# Patient Record
Sex: Female | Born: 1945
Health system: Southern US, Community
[De-identification: ages and names within clinical notes are randomized; demographics above are authoritative.]

## PROBLEM LIST (undated history)

## (undated) DIAGNOSIS — G459 Transient cerebral ischemic attack, unspecified: Secondary | ICD-10-CM

## (undated) DIAGNOSIS — N6019 Diffuse cystic mastopathy of unspecified breast: Secondary | ICD-10-CM

## (undated) DIAGNOSIS — E739 Lactose intolerance, unspecified: Secondary | ICD-10-CM

## (undated) DIAGNOSIS — I219 Acute myocardial infarction, unspecified: Secondary | ICD-10-CM

## (undated) DIAGNOSIS — K219 Gastro-esophageal reflux disease without esophagitis: Secondary | ICD-10-CM

## (undated) DIAGNOSIS — I10 Essential (primary) hypertension: Secondary | ICD-10-CM

## (undated) DIAGNOSIS — E079 Disorder of thyroid, unspecified: Secondary | ICD-10-CM

## (undated) DIAGNOSIS — M1990 Unspecified osteoarthritis, unspecified site: Secondary | ICD-10-CM

## (undated) DIAGNOSIS — H409 Unspecified glaucoma: Secondary | ICD-10-CM

## (undated) DIAGNOSIS — R159 Full incontinence of feces: Secondary | ICD-10-CM

## (undated) DIAGNOSIS — R0989 Other specified symptoms and signs involving the circulatory and respiratory systems: Secondary | ICD-10-CM

## (undated) DIAGNOSIS — K589 Irritable bowel syndrome without diarrhea: Secondary | ICD-10-CM

## (undated) HISTORY — DX: Full incontinence of feces: R15.9

## (undated) HISTORY — DX: Gastro-esophageal reflux disease without esophagitis: K21.9

## (undated) HISTORY — DX: Unspecified glaucoma: H40.9

## (undated) HISTORY — PX: OTHER SURGICAL HISTORY: SHX169

## (undated) HISTORY — DX: Essential (primary) hypertension: I10

## (undated) HISTORY — DX: Other specified symptoms and signs involving the circulatory and respiratory systems: R09.89

## (undated) HISTORY — DX: Unspecified osteoarthritis, unspecified site: M19.90

## (undated) HISTORY — DX: Irritable bowel syndrome without diarrhea: K58.9

## (undated) HISTORY — PX: FOOT SURGERY: SHX648

## (undated) HISTORY — PX: ABDOMINAL HYSTERECTOMY: SHX81

## (undated) HISTORY — DX: Lactose intolerance, unspecified: E73.9

## (undated) HISTORY — DX: Acute myocardial infarction, unspecified: I21.9

## (undated) HISTORY — DX: Irritable bowel syndrome, unspecified: K58.9

## (undated) HISTORY — DX: Diffuse cystic mastopathy of unspecified breast: N60.19

## (undated) HISTORY — PX: BREAST SURGERY: SHX581

## (undated) HISTORY — PX: CATARACT EXTRACTION: SUR2

## (undated) HISTORY — DX: Transient cerebral ischemic attack, unspecified: G45.9

## (undated) HISTORY — DX: Disorder of thyroid, unspecified: E07.9

---

## 1998-02-18 ENCOUNTER — Other Ambulatory Visit: Admission: RE | Admit: 1998-02-18 | Discharge: 1998-02-18 | Payer: Self-pay | Admitting: Obstetrics

## 1999-09-01 ENCOUNTER — Other Ambulatory Visit: Admission: RE | Admit: 1999-09-01 | Discharge: 1999-09-01 | Payer: Self-pay | Admitting: Obstetrics

## 2011-05-17 HISTORY — PX: ESOPHAGOGASTRODUODENOSCOPY: SHX1529

## 2012-12-05 HISTORY — PX: THYROID SURGERY: SHX805

## 2013-01-25 ENCOUNTER — Ambulatory Visit (INDEPENDENT_AMBULATORY_CARE_PROVIDER_SITE_OTHER): Payer: Medicare Other

## 2013-01-25 VITALS — BP 127/76 | HR 71 | Resp 16 | Ht 65.0 in | Wt 148.0 lb

## 2013-01-25 DIAGNOSIS — M79609 Pain in unspecified limb: Secondary | ICD-10-CM

## 2013-01-25 DIAGNOSIS — M1990 Unspecified osteoarthritis, unspecified site: Secondary | ICD-10-CM

## 2013-01-25 DIAGNOSIS — G629 Polyneuropathy, unspecified: Secondary | ICD-10-CM

## 2013-01-25 DIAGNOSIS — M2040 Other hammer toe(s) (acquired), unspecified foot: Secondary | ICD-10-CM

## 2013-01-25 DIAGNOSIS — H409 Unspecified glaucoma: Secondary | ICD-10-CM | POA: Insufficient documentation

## 2013-01-25 DIAGNOSIS — G609 Hereditary and idiopathic neuropathy, unspecified: Secondary | ICD-10-CM

## 2013-01-25 MED ORDER — GABAPENTIN 300 MG PO CAPS: 300.0000 mg | ORAL_CAPSULE | Freq: Every day | ORAL | Status: DC

## 2013-01-25 NOTE — Progress Notes (Signed)
   Subjective:    Patient ID: Katrina Rocha, female    DOB: 05-19-1945, 68 y.o.   MRN: [id number]  HPI  N burning toes         L B/L 1 - 5 toe tips to varying degrees of severity         D 2 months         O on and off         C burning and tiredness         A no known stimulus         T no treatments, pt uses a athletes foot cream almost everyday    Review of Systems  Constitutional: Positive for activity change.       Thyroid surgery in November 2014  HENT: Positive for sinus pressure.   Eyes:       Burning eyes  Respiratory: Negative.   Cardiovascular: Positive for leg swelling.  Gastrointestinal: Negative.   Endocrine: Positive for cold intolerance.  Genitourinary: Negative.   Musculoskeletal: Positive for arthralgias.       Arms tighten and hands and wrist ache  Skin:       Hair thinning  Allergic/Immunologic: Positive for environmental allergies.       Seasonal allergies  Neurological: Negative.   Hematological: Negative.   Psychiatric/Behavioral: Negative.        Objective:   Physical Exam Vascular status is intact with pedal pulses palpable DP pulse two over four bilateral PT plus one over 4 bilateral Refill timed 3-4 seconds all digits. Skin temperature warm turgor normal no edema rubor pallor or varicosities noted. Patient has some edema over the left leg more so than right no past. Orthopedic biomechanical exam reveals rectus hallux semirigid digital contractures 234 and 5 with adductovarus rotation is. They're nonreducible. Dermatologically skin color pigment normal hair growth absent nails somewhat criptotic otherwise unremarkable no open wounds or ulcerations are noted. No x-rays taken at this time. There is pain on direct lateral compression of the forefoot the interspaces second and third interspace bilateral possible painful tender possibly early neuroma symptomology versus capsulitis in arthropathy. Patient also has a history of back pain and back problems  for years ago has not had evaluated many years cannot rule out possibly some peripheral neuropathy secondary to lumbar radiculopathy. Patient also relates that the burning in her feet also correlates with when she had surgery for her thyroid had nodules on her thyroid removed. Patient is advised that thyroid may also cause some abnormal sensations and paresthesias in the feet and hands.      Assessment & Plan:  Assessment this time peripheral neuropathy cannot run early neuroma symptomology versus radiculopathy a lumbar compression. There is definite osteoarthropathy and hammertoe deformities lesser digits 2 through 5 bilateral this may be contributing to symptomology however there is definitely some neurologic association. Plan at this time patient placed on a trial of gabapentin 3 mg each bedtime recheck within 3 months if she continues to have problems or symptoms are there is no improvement may just dosing may try other alternatives also the future consider steroid injections etc. patient wearing good shoes such as new balance suggested new balance or a.c. shoes maintain orthoses. Recheck in 3 months for reevaluation and reassessment of benefit of gabapentin.  Alvan Dameichard Etoy Mcdonnell DPM

## 2013-01-25 NOTE — Patient Instructions (Signed)

## 2014-02-28 DIAGNOSIS — J3089 Other allergic rhinitis: Secondary | ICD-10-CM | POA: Diagnosis not present

## 2014-02-28 DIAGNOSIS — J3081 Allergic rhinitis due to animal (cat) (dog) hair and dander: Secondary | ICD-10-CM | POA: Diagnosis not present

## 2014-02-28 DIAGNOSIS — J301 Allergic rhinitis due to pollen: Secondary | ICD-10-CM | POA: Diagnosis not present

## 2014-02-28 DIAGNOSIS — K219 Gastro-esophageal reflux disease without esophagitis: Secondary | ICD-10-CM | POA: Diagnosis not present

## 2014-03-14 DIAGNOSIS — Z6826 Body mass index (BMI) 26.0-26.9, adult: Secondary | ICD-10-CM | POA: Diagnosis not present

## 2014-03-14 DIAGNOSIS — J302 Other seasonal allergic rhinitis: Secondary | ICD-10-CM | POA: Diagnosis not present

## 2014-04-04 DIAGNOSIS — H4011X1 Primary open-angle glaucoma, mild stage: Secondary | ICD-10-CM | POA: Diagnosis not present

## 2014-05-23 DIAGNOSIS — Z79899 Other long term (current) drug therapy: Secondary | ICD-10-CM | POA: Diagnosis not present

## 2014-05-23 DIAGNOSIS — E042 Nontoxic multinodular goiter: Secondary | ICD-10-CM | POA: Diagnosis not present

## 2014-05-23 DIAGNOSIS — I10 Essential (primary) hypertension: Secondary | ICD-10-CM | POA: Diagnosis not present

## 2014-05-23 DIAGNOSIS — Z23 Encounter for immunization: Secondary | ICD-10-CM | POA: Diagnosis not present

## 2014-05-23 DIAGNOSIS — G459 Transient cerebral ischemic attack, unspecified: Secondary | ICD-10-CM | POA: Diagnosis not present

## 2014-05-23 DIAGNOSIS — Z1389 Encounter for screening for other disorder: Secondary | ICD-10-CM | POA: Diagnosis not present

## 2014-05-23 DIAGNOSIS — E785 Hyperlipidemia, unspecified: Secondary | ICD-10-CM | POA: Diagnosis not present

## 2014-06-10 DIAGNOSIS — E042 Nontoxic multinodular goiter: Secondary | ICD-10-CM | POA: Diagnosis not present

## 2014-06-10 DIAGNOSIS — E041 Nontoxic single thyroid nodule: Secondary | ICD-10-CM | POA: Diagnosis not present

## 2014-06-20 DIAGNOSIS — K219 Gastro-esophageal reflux disease without esophagitis: Secondary | ICD-10-CM | POA: Diagnosis not present

## 2014-06-20 DIAGNOSIS — E042 Nontoxic multinodular goiter: Secondary | ICD-10-CM | POA: Diagnosis not present

## 2014-08-08 DIAGNOSIS — H4011X1 Primary open-angle glaucoma, mild stage: Secondary | ICD-10-CM | POA: Diagnosis not present

## 2014-08-09 DIAGNOSIS — G4762 Sleep related leg cramps: Secondary | ICD-10-CM | POA: Diagnosis not present

## 2014-08-09 DIAGNOSIS — J309 Allergic rhinitis, unspecified: Secondary | ICD-10-CM | POA: Diagnosis not present

## 2014-08-09 DIAGNOSIS — Z6825 Body mass index (BMI) 25.0-25.9, adult: Secondary | ICD-10-CM | POA: Diagnosis not present

## 2014-10-29 DIAGNOSIS — R928 Other abnormal and inconclusive findings on diagnostic imaging of breast: Secondary | ICD-10-CM | POA: Diagnosis not present

## 2014-10-29 DIAGNOSIS — Z803 Family history of malignant neoplasm of breast: Secondary | ICD-10-CM | POA: Diagnosis not present

## 2014-10-29 DIAGNOSIS — N644 Mastodynia: Secondary | ICD-10-CM | POA: Diagnosis not present

## 2014-11-28 DIAGNOSIS — E785 Hyperlipidemia, unspecified: Secondary | ICD-10-CM | POA: Diagnosis not present

## 2014-11-28 DIAGNOSIS — I10 Essential (primary) hypertension: Secondary | ICD-10-CM | POA: Diagnosis not present

## 2014-11-28 DIAGNOSIS — Z79899 Other long term (current) drug therapy: Secondary | ICD-10-CM | POA: Diagnosis not present

## 2014-11-28 DIAGNOSIS — E042 Nontoxic multinodular goiter: Secondary | ICD-10-CM | POA: Diagnosis not present

## 2014-11-28 DIAGNOSIS — Z23 Encounter for immunization: Secondary | ICD-10-CM | POA: Diagnosis not present

## 2014-11-28 DIAGNOSIS — Z1389 Encounter for screening for other disorder: Secondary | ICD-10-CM | POA: Diagnosis not present

## 2014-11-28 DIAGNOSIS — G459 Transient cerebral ischemic attack, unspecified: Secondary | ICD-10-CM | POA: Diagnosis not present

## 2014-11-28 DIAGNOSIS — Z9181 History of falling: Secondary | ICD-10-CM | POA: Diagnosis not present

## 2014-12-13 DIAGNOSIS — R110 Nausea: Secondary | ICD-10-CM | POA: Diagnosis not present

## 2015-01-16 DIAGNOSIS — J019 Acute sinusitis, unspecified: Secondary | ICD-10-CM | POA: Diagnosis not present

## 2015-01-16 DIAGNOSIS — J302 Other seasonal allergic rhinitis: Secondary | ICD-10-CM | POA: Diagnosis not present

## 2015-03-13 DIAGNOSIS — H401131 Primary open-angle glaucoma, bilateral, mild stage: Secondary | ICD-10-CM | POA: Diagnosis not present

## 2015-06-05 DIAGNOSIS — G459 Transient cerebral ischemic attack, unspecified: Secondary | ICD-10-CM | POA: Diagnosis not present

## 2015-06-05 DIAGNOSIS — E042 Nontoxic multinodular goiter: Secondary | ICD-10-CM | POA: Diagnosis not present

## 2015-06-05 DIAGNOSIS — Z79899 Other long term (current) drug therapy: Secondary | ICD-10-CM | POA: Diagnosis not present

## 2015-06-05 DIAGNOSIS — E785 Hyperlipidemia, unspecified: Secondary | ICD-10-CM | POA: Diagnosis not present

## 2015-06-05 DIAGNOSIS — R399 Unspecified symptoms and signs involving the genitourinary system: Secondary | ICD-10-CM | POA: Diagnosis not present

## 2015-06-05 DIAGNOSIS — I10 Essential (primary) hypertension: Secondary | ICD-10-CM | POA: Diagnosis not present

## 2015-06-05 DIAGNOSIS — E663 Overweight: Secondary | ICD-10-CM | POA: Diagnosis not present

## 2015-06-11 DIAGNOSIS — N390 Urinary tract infection, site not specified: Secondary | ICD-10-CM | POA: Diagnosis not present

## 2015-06-12 DIAGNOSIS — H401131 Primary open-angle glaucoma, bilateral, mild stage: Secondary | ICD-10-CM | POA: Diagnosis not present

## 2015-06-16 DIAGNOSIS — K219 Gastro-esophageal reflux disease without esophagitis: Secondary | ICD-10-CM | POA: Diagnosis not present

## 2015-06-16 DIAGNOSIS — L723 Sebaceous cyst: Secondary | ICD-10-CM | POA: Diagnosis not present

## 2015-06-16 DIAGNOSIS — E042 Nontoxic multinodular goiter: Secondary | ICD-10-CM | POA: Diagnosis not present

## 2015-06-30 DIAGNOSIS — N3020 Other chronic cystitis without hematuria: Secondary | ICD-10-CM | POA: Diagnosis not present

## 2015-06-30 DIAGNOSIS — N281 Cyst of kidney, acquired: Secondary | ICD-10-CM | POA: Diagnosis not present

## 2015-07-01 DIAGNOSIS — E042 Nontoxic multinodular goiter: Secondary | ICD-10-CM | POA: Diagnosis not present

## 2015-07-01 DIAGNOSIS — E041 Nontoxic single thyroid nodule: Secondary | ICD-10-CM | POA: Diagnosis not present

## 2015-07-08 DIAGNOSIS — E041 Nontoxic single thyroid nodule: Secondary | ICD-10-CM | POA: Diagnosis not present

## 2015-07-08 DIAGNOSIS — L723 Sebaceous cyst: Secondary | ICD-10-CM | POA: Diagnosis not present

## 2015-07-08 DIAGNOSIS — L720 Epidermal cyst: Secondary | ICD-10-CM | POA: Diagnosis not present

## 2015-07-09 DIAGNOSIS — M79601 Pain in right arm: Secondary | ICD-10-CM | POA: Diagnosis not present

## 2015-07-09 DIAGNOSIS — M25811 Other specified joint disorders, right shoulder: Secondary | ICD-10-CM | POA: Insufficient documentation

## 2015-07-09 DIAGNOSIS — M19011 Primary osteoarthritis, right shoulder: Secondary | ICD-10-CM | POA: Diagnosis not present

## 2015-07-09 DIAGNOSIS — M7541 Impingement syndrome of right shoulder: Secondary | ICD-10-CM | POA: Diagnosis not present

## 2015-07-09 DIAGNOSIS — M47812 Spondylosis without myelopathy or radiculopathy, cervical region: Secondary | ICD-10-CM | POA: Diagnosis not present

## 2015-09-23 DIAGNOSIS — R1032 Left lower quadrant pain: Secondary | ICD-10-CM | POA: Diagnosis not present

## 2015-10-02 DIAGNOSIS — R1032 Left lower quadrant pain: Secondary | ICD-10-CM | POA: Diagnosis not present

## 2015-10-02 DIAGNOSIS — R109 Unspecified abdominal pain: Secondary | ICD-10-CM | POA: Diagnosis not present

## 2015-11-04 DIAGNOSIS — Z1231 Encounter for screening mammogram for malignant neoplasm of breast: Secondary | ICD-10-CM | POA: Diagnosis not present

## 2015-11-07 DIAGNOSIS — Z23 Encounter for immunization: Secondary | ICD-10-CM | POA: Diagnosis not present

## 2015-11-11 DIAGNOSIS — H401131 Primary open-angle glaucoma, bilateral, mild stage: Secondary | ICD-10-CM | POA: Diagnosis not present

## 2015-12-11 DIAGNOSIS — Z1389 Encounter for screening for other disorder: Secondary | ICD-10-CM | POA: Diagnosis not present

## 2015-12-11 DIAGNOSIS — E042 Nontoxic multinodular goiter: Secondary | ICD-10-CM | POA: Diagnosis not present

## 2015-12-11 DIAGNOSIS — E785 Hyperlipidemia, unspecified: Secondary | ICD-10-CM | POA: Diagnosis not present

## 2015-12-11 DIAGNOSIS — Z79899 Other long term (current) drug therapy: Secondary | ICD-10-CM | POA: Diagnosis not present

## 2015-12-11 DIAGNOSIS — I10 Essential (primary) hypertension: Secondary | ICD-10-CM | POA: Diagnosis not present

## 2015-12-11 DIAGNOSIS — G459 Transient cerebral ischemic attack, unspecified: Secondary | ICD-10-CM | POA: Diagnosis not present

## 2015-12-11 DIAGNOSIS — Z9181 History of falling: Secondary | ICD-10-CM | POA: Diagnosis not present

## 2015-12-29 DIAGNOSIS — M85851 Other specified disorders of bone density and structure, right thigh: Secondary | ICD-10-CM | POA: Diagnosis not present

## 2015-12-29 DIAGNOSIS — M8589 Other specified disorders of bone density and structure, multiple sites: Secondary | ICD-10-CM | POA: Diagnosis not present

## 2016-01-07 DIAGNOSIS — A084 Viral intestinal infection, unspecified: Secondary | ICD-10-CM | POA: Diagnosis not present

## 2016-03-02 DIAGNOSIS — H401131 Primary open-angle glaucoma, bilateral, mild stage: Secondary | ICD-10-CM | POA: Diagnosis not present

## 2016-06-08 DIAGNOSIS — M7541 Impingement syndrome of right shoulder: Secondary | ICD-10-CM | POA: Diagnosis not present

## 2016-06-10 DIAGNOSIS — Z139 Encounter for screening, unspecified: Secondary | ICD-10-CM | POA: Diagnosis not present

## 2016-06-10 DIAGNOSIS — E785 Hyperlipidemia, unspecified: Secondary | ICD-10-CM | POA: Diagnosis not present

## 2016-06-10 DIAGNOSIS — I10 Essential (primary) hypertension: Secondary | ICD-10-CM | POA: Diagnosis not present

## 2016-06-10 DIAGNOSIS — Z79899 Other long term (current) drug therapy: Secondary | ICD-10-CM | POA: Diagnosis not present

## 2016-06-10 DIAGNOSIS — G459 Transient cerebral ischemic attack, unspecified: Secondary | ICD-10-CM | POA: Diagnosis not present

## 2016-06-10 DIAGNOSIS — E042 Nontoxic multinodular goiter: Secondary | ICD-10-CM | POA: Diagnosis not present

## 2016-06-22 DIAGNOSIS — N390 Urinary tract infection, site not specified: Secondary | ICD-10-CM | POA: Diagnosis not present

## 2016-06-23 DIAGNOSIS — N390 Urinary tract infection, site not specified: Secondary | ICD-10-CM | POA: Diagnosis not present

## 2016-07-01 DIAGNOSIS — H401131 Primary open-angle glaucoma, bilateral, mild stage: Secondary | ICD-10-CM | POA: Diagnosis not present

## 2016-10-07 DIAGNOSIS — K625 Hemorrhage of anus and rectum: Secondary | ICD-10-CM | POA: Diagnosis not present

## 2016-10-07 DIAGNOSIS — R1030 Lower abdominal pain, unspecified: Secondary | ICD-10-CM | POA: Diagnosis not present

## 2016-10-19 DIAGNOSIS — E785 Hyperlipidemia, unspecified: Secondary | ICD-10-CM | POA: Diagnosis not present

## 2016-10-19 DIAGNOSIS — Z1389 Encounter for screening for other disorder: Secondary | ICD-10-CM | POA: Diagnosis not present

## 2016-10-19 DIAGNOSIS — Z23 Encounter for immunization: Secondary | ICD-10-CM | POA: Diagnosis not present

## 2016-10-19 DIAGNOSIS — Z1231 Encounter for screening mammogram for malignant neoplasm of breast: Secondary | ICD-10-CM | POA: Diagnosis not present

## 2016-10-19 DIAGNOSIS — Z9181 History of falling: Secondary | ICD-10-CM | POA: Diagnosis not present

## 2016-10-19 DIAGNOSIS — Z0000 Encounter for general adult medical examination without abnormal findings: Secondary | ICD-10-CM | POA: Diagnosis not present

## 2016-10-21 DIAGNOSIS — M19011 Primary osteoarthritis, right shoulder: Secondary | ICD-10-CM | POA: Diagnosis not present

## 2016-10-21 DIAGNOSIS — M7541 Impingement syndrome of right shoulder: Secondary | ICD-10-CM | POA: Diagnosis not present

## 2016-10-27 DIAGNOSIS — K5730 Diverticulosis of large intestine without perforation or abscess without bleeding: Secondary | ICD-10-CM | POA: Diagnosis not present

## 2016-10-27 DIAGNOSIS — Z7982 Long term (current) use of aspirin: Secondary | ICD-10-CM | POA: Diagnosis not present

## 2016-10-27 DIAGNOSIS — K648 Other hemorrhoids: Secondary | ICD-10-CM | POA: Diagnosis not present

## 2016-10-27 DIAGNOSIS — Z79899 Other long term (current) drug therapy: Secondary | ICD-10-CM | POA: Diagnosis not present

## 2016-10-27 DIAGNOSIS — M1990 Unspecified osteoarthritis, unspecified site: Secondary | ICD-10-CM | POA: Diagnosis not present

## 2016-10-27 DIAGNOSIS — K219 Gastro-esophageal reflux disease without esophagitis: Secondary | ICD-10-CM | POA: Diagnosis not present

## 2016-10-27 DIAGNOSIS — K649 Unspecified hemorrhoids: Secondary | ICD-10-CM | POA: Diagnosis not present

## 2016-10-27 DIAGNOSIS — G459 Transient cerebral ischemic attack, unspecified: Secondary | ICD-10-CM | POA: Diagnosis not present

## 2016-10-27 DIAGNOSIS — R1030 Lower abdominal pain, unspecified: Secondary | ICD-10-CM | POA: Diagnosis not present

## 2016-10-27 DIAGNOSIS — Q438 Other specified congenital malformations of intestine: Secondary | ICD-10-CM | POA: Diagnosis not present

## 2016-10-27 DIAGNOSIS — I10 Essential (primary) hypertension: Secondary | ICD-10-CM | POA: Diagnosis not present

## 2016-10-27 DIAGNOSIS — E782 Mixed hyperlipidemia: Secondary | ICD-10-CM | POA: Diagnosis not present

## 2016-10-27 DIAGNOSIS — K625 Hemorrhage of anus and rectum: Secondary | ICD-10-CM | POA: Diagnosis not present

## 2016-10-27 HISTORY — PX: COLONOSCOPY: SHX174

## 2016-10-28 DIAGNOSIS — H401131 Primary open-angle glaucoma, bilateral, mild stage: Secondary | ICD-10-CM | POA: Diagnosis not present

## 2016-11-04 DIAGNOSIS — E041 Nontoxic single thyroid nodule: Secondary | ICD-10-CM | POA: Diagnosis not present

## 2016-11-08 DIAGNOSIS — M7541 Impingement syndrome of right shoulder: Secondary | ICD-10-CM | POA: Diagnosis not present

## 2016-11-08 DIAGNOSIS — M75101 Unspecified rotator cuff tear or rupture of right shoulder, not specified as traumatic: Secondary | ICD-10-CM | POA: Diagnosis not present

## 2016-11-16 DIAGNOSIS — E041 Nontoxic single thyroid nodule: Secondary | ICD-10-CM | POA: Diagnosis not present

## 2016-11-16 DIAGNOSIS — Z9889 Other specified postprocedural states: Secondary | ICD-10-CM | POA: Diagnosis not present

## 2016-12-03 DIAGNOSIS — Z1231 Encounter for screening mammogram for malignant neoplasm of breast: Secondary | ICD-10-CM | POA: Diagnosis not present

## 2016-12-09 DIAGNOSIS — M75111 Incomplete rotator cuff tear or rupture of right shoulder, not specified as traumatic: Secondary | ICD-10-CM | POA: Diagnosis not present

## 2016-12-14 DIAGNOSIS — M75111 Incomplete rotator cuff tear or rupture of right shoulder, not specified as traumatic: Secondary | ICD-10-CM | POA: Insufficient documentation

## 2016-12-16 DIAGNOSIS — E785 Hyperlipidemia, unspecified: Secondary | ICD-10-CM | POA: Diagnosis not present

## 2016-12-16 DIAGNOSIS — I10 Essential (primary) hypertension: Secondary | ICD-10-CM | POA: Diagnosis not present

## 2016-12-16 DIAGNOSIS — Z139 Encounter for screening, unspecified: Secondary | ICD-10-CM | POA: Diagnosis not present

## 2016-12-16 DIAGNOSIS — G459 Transient cerebral ischemic attack, unspecified: Secondary | ICD-10-CM | POA: Diagnosis not present

## 2016-12-16 DIAGNOSIS — Z79899 Other long term (current) drug therapy: Secondary | ICD-10-CM | POA: Diagnosis not present

## 2016-12-16 DIAGNOSIS — E042 Nontoxic multinodular goiter: Secondary | ICD-10-CM | POA: Diagnosis not present

## 2017-03-01 DIAGNOSIS — H401131 Primary open-angle glaucoma, bilateral, mild stage: Secondary | ICD-10-CM | POA: Diagnosis not present

## 2017-05-24 DIAGNOSIS — I10 Essential (primary) hypertension: Secondary | ICD-10-CM | POA: Diagnosis not present

## 2017-05-24 DIAGNOSIS — Z809 Family history of malignant neoplasm, unspecified: Secondary | ICD-10-CM | POA: Diagnosis not present

## 2017-05-24 DIAGNOSIS — K219 Gastro-esophageal reflux disease without esophagitis: Secondary | ICD-10-CM | POA: Diagnosis not present

## 2017-05-24 DIAGNOSIS — G8929 Other chronic pain: Secondary | ICD-10-CM | POA: Diagnosis not present

## 2017-05-24 DIAGNOSIS — Z803 Family history of malignant neoplasm of breast: Secondary | ICD-10-CM | POA: Diagnosis not present

## 2017-05-24 DIAGNOSIS — Z7982 Long term (current) use of aspirin: Secondary | ICD-10-CM | POA: Diagnosis not present

## 2017-05-24 DIAGNOSIS — E785 Hyperlipidemia, unspecified: Secondary | ICD-10-CM | POA: Diagnosis not present

## 2017-05-24 DIAGNOSIS — K08409 Partial loss of teeth, unspecified cause, unspecified class: Secondary | ICD-10-CM | POA: Diagnosis not present

## 2017-05-24 DIAGNOSIS — J309 Allergic rhinitis, unspecified: Secondary | ICD-10-CM | POA: Diagnosis not present

## 2017-05-24 DIAGNOSIS — M1990 Unspecified osteoarthritis, unspecified site: Secondary | ICD-10-CM | POA: Diagnosis not present

## 2017-05-31 DIAGNOSIS — H401131 Primary open-angle glaucoma, bilateral, mild stage: Secondary | ICD-10-CM | POA: Diagnosis not present

## 2017-06-07 DIAGNOSIS — R69 Illness, unspecified: Secondary | ICD-10-CM | POA: Diagnosis not present

## 2017-06-20 ENCOUNTER — Ambulatory Visit: Payer: Self-pay | Admitting: Podiatry

## 2017-06-21 ENCOUNTER — Ambulatory Visit (INDEPENDENT_AMBULATORY_CARE_PROVIDER_SITE_OTHER): Payer: Medicare HMO

## 2017-06-21 ENCOUNTER — Ambulatory Visit (INDEPENDENT_AMBULATORY_CARE_PROVIDER_SITE_OTHER): Payer: Medicare HMO | Admitting: Podiatry

## 2017-06-21 ENCOUNTER — Encounter: Payer: Self-pay | Admitting: Podiatry

## 2017-06-21 VITALS — BP 174/88 | HR 60 | Temp 98.1°F | Resp 16 | Ht 64.0 in | Wt 160.0 lb

## 2017-06-21 DIAGNOSIS — H8113 Benign paroxysmal vertigo, bilateral: Secondary | ICD-10-CM | POA: Diagnosis not present

## 2017-06-21 DIAGNOSIS — M2040 Other hammer toe(s) (acquired), unspecified foot: Secondary | ICD-10-CM

## 2017-06-21 DIAGNOSIS — G5763 Lesion of plantar nerve, bilateral lower limbs: Secondary | ICD-10-CM | POA: Diagnosis not present

## 2017-06-21 DIAGNOSIS — E785 Hyperlipidemia, unspecified: Secondary | ICD-10-CM | POA: Diagnosis not present

## 2017-06-21 DIAGNOSIS — M779 Enthesopathy, unspecified: Secondary | ICD-10-CM

## 2017-06-21 DIAGNOSIS — Z6827 Body mass index (BMI) 27.0-27.9, adult: Secondary | ICD-10-CM | POA: Diagnosis not present

## 2017-06-21 DIAGNOSIS — E042 Nontoxic multinodular goiter: Secondary | ICD-10-CM | POA: Diagnosis not present

## 2017-06-21 DIAGNOSIS — B351 Tinea unguium: Secondary | ICD-10-CM | POA: Diagnosis not present

## 2017-06-21 DIAGNOSIS — M8589 Other specified disorders of bone density and structure, multiple sites: Secondary | ICD-10-CM | POA: Diagnosis not present

## 2017-06-21 DIAGNOSIS — G459 Transient cerebral ischemic attack, unspecified: Secondary | ICD-10-CM | POA: Diagnosis not present

## 2017-06-21 DIAGNOSIS — Z79899 Other long term (current) drug therapy: Secondary | ICD-10-CM | POA: Diagnosis not present

## 2017-06-21 DIAGNOSIS — I10 Essential (primary) hypertension: Secondary | ICD-10-CM | POA: Diagnosis not present

## 2017-06-21 MED ORDER — KETOCONAZOLE 2 % EX CREA: TOPICAL_CREAM | CUTANEOUS | 0 refills | Status: DC

## 2017-06-21 NOTE — Progress Notes (Signed)
   Subjective:    Patient ID: Katrina Rocha, female    DOB: March 11, 1945, 72 y.o.   MRN: [id number]  HPI    Review of Systems  Musculoskeletal: Positive for arthralgias, joint swelling and myalgias.  All other systems reviewed and are negative.      Objective:   Physical Exam        Assessment & Plan:

## 2017-06-30 ENCOUNTER — Other Ambulatory Visit: Payer: Self-pay | Admitting: Podiatry

## 2017-06-30 DIAGNOSIS — M79672 Pain in left foot: Secondary | ICD-10-CM

## 2017-06-30 DIAGNOSIS — M2040 Other hammer toe(s) (acquired), unspecified foot: Secondary | ICD-10-CM

## 2017-06-30 DIAGNOSIS — M79671 Pain in right foot: Secondary | ICD-10-CM

## 2017-06-30 DIAGNOSIS — M779 Enthesopathy, unspecified: Secondary | ICD-10-CM

## 2017-07-10 NOTE — Progress Notes (Signed)
  Subjective:  Patient ID: Katrina Rocha, female    DOB: 11-03-1945,  MRN: [id number]  Chief Complaint  Patient presents with  . Foot Pain    B/L 1st sub met x 3 mo; 4/10 intermittent pain -no injury Tx: tylenol Pt. stated," I've also been starting toe have pain and burning in the inner side of my foot."   . Nail Problem    B/L 4th toenails thickening and discoloration x years Tx: none   . Callouses    B/L callus trimming   72 y.o. female presents with the above complaint.  Reports callus underneath the first toe bilaterally for the past 3 months reports 4-10 intermittent pain denies injury.  Has been using Tylenol.  States that starting to have some pain and burning.  Reports problem with the toenails of both fourth toes with discoloration for several years  Past Medical History:  Diagnosis Date  . Arthritis   . GERD (gastroesophageal reflux disease)   . Glaucoma   . Glaucoma   . Glaucoma   . Glaucoma   . Heart attack ([place])   . Hypertension   . Irritable bowel syndrome   . Sinus complaint   . Thyroid disease    Past Surgical History:  Procedure Laterality Date  . ABDOMINAL HYSTERECTOMY    . BREAST SURGERY    . FOOT SURGERY Right   . THYROID SURGERY  12/05/2012  . torn tendon surgery      Current Outpatient Medications:  .  aspirin 325 MG tablet, Take 325 mg by mouth daily., Disp: , Rfl:  .  Calcium Carbonate (CALTRATE 600 PO), Take by mouth., Disp: , Rfl:  .  ketoconazole (NIZORAL) 2 % cream, Apply 1 fingertip amount to each foot daily., Disp: 30 g, Rfl: 0 .  lisinopril (PRINIVIL,ZESTRIL) 20 MG tablet, Take 20 mg by mouth 2 (two) times daily., Disp: , Rfl:  .  MULTIPLE VITAMIN PO, Take by mouth., Disp: , Rfl:  .  omeprazole (PRILOSEC) 40 MG capsule, Take 40 mg by mouth daily., Disp: , Rfl:  .  rosuvastatin (CRESTOR) 10 MG tablet, Take 10 mg by mouth daily. Not sure of the mg/lc, Disp: , Rfl:   Allergies  Allergen Reactions  . Latex Rash  . Tape Rash   Review of  Systems: Negative except as noted in the HPI. Denies N/V/F/Ch. Objective:   Vitals:   06/21/17 1357  BP: (!) 174/88  Pulse: 60  Resp: 16  Temp: 98.1 F (36.7 C)   General AA&O x3. Normal mood and affect.  Vascular Dorsalis pedis and posterior tibial pulses  present 2+ bilaterally  Capillary refill normal to all digits. Pedal hair growth normal.  Neurologic Epicritic sensation grossly present.  Dermatologic No open lesions. Interspaces clear of maceration. Nails discolored and thickened  Orthopedic: MMT 5/5 in dorsiflexion, plantarflexion, inversion, and eversion. Normal joint ROM without pain or crepitus.  Pain palpation left fourth interspace, right fourth interspace with palpable Mulder's click    Assessment & Plan:  Patient was evaluated and treated and all questions answered.  Neuroma Bilat, Calluses bilat., Onychomycosis -X-rays taken reviewed consistent with above -Callus pared bilaterally -Rx ketoconazole  Return in about 6 weeks (around 08/02/2017) for Nail Fungus, Neuroma.

## 2017-08-02 ENCOUNTER — Ambulatory Visit (INDEPENDENT_AMBULATORY_CARE_PROVIDER_SITE_OTHER): Payer: Medicare HMO | Admitting: Podiatry

## 2017-08-02 DIAGNOSIS — B351 Tinea unguium: Secondary | ICD-10-CM

## 2017-08-02 DIAGNOSIS — G5763 Lesion of plantar nerve, bilateral lower limbs: Secondary | ICD-10-CM

## 2017-08-02 DIAGNOSIS — B353 Tinea pedis: Secondary | ICD-10-CM

## 2017-08-02 DIAGNOSIS — R69 Illness, unspecified: Secondary | ICD-10-CM | POA: Diagnosis not present

## 2017-08-02 MED ORDER — FLUCONAZOLE 150 MG PO TABS: 150.0000 mg | ORAL_TABLET | ORAL | 1 refills | Status: DC

## 2017-08-02 NOTE — Progress Notes (Signed)
  Subjective:  Patient ID: Katrina Rocha, female    DOB: 1945/06/16,  MRN: [id number]  Chief Complaint  Patient presents with  . Neuroma    F/U B/L neuroma Pt. stated," improving."   . Nail Problem    F/U nail fungus Pt. stated," my feet are not peeling as bad." Tx; ketaconazole   72 y.o. female presents with the above complaint.  States her feet are supinated on his belly.  States that her arms are doing much better.  Denies new issues.  Past Medical History:  Diagnosis Date  . Arthritis   . GERD (gastroesophageal reflux disease)   . Glaucoma   . Glaucoma   . Glaucoma   . Glaucoma   . Heart attack ([facility])   . Hypertension   . Irritable bowel syndrome   . Sinus complaint   . Thyroid disease    Past Surgical History:  Procedure Laterality Date  . ABDOMINAL HYSTERECTOMY    . BREAST SURGERY    . FOOT SURGERY Right   . THYROID SURGERY  12/05/2012  . torn tendon surgery      Current Outpatient Medications:  .  aspirin 325 MG tablet, Take 325 mg by mouth daily., Disp: , Rfl:  .  Calcium Carbonate (CALTRATE 600 PO), Take by mouth., Disp: , Rfl:  .  ketoconazole (NIZORAL) 2 % cream, Apply 1 fingertip amount to each foot daily., Disp: 30 g, Rfl: 0 .  lisinopril (PRINIVIL,ZESTRIL) 20 MG tablet, Take 20 mg by mouth 2 (two) times daily., Disp: , Rfl:  .  MULTIPLE VITAMIN PO, Take by mouth., Disp: , Rfl:  .  omeprazole (PRILOSEC) 40 MG capsule, Take 40 mg by mouth daily., Disp: , Rfl:  .  rosuvastatin (CRESTOR) 10 MG tablet, Take 10 mg by mouth daily. Not sure of the mg/lc, Disp: , Rfl:  .  fluconazole (DIFLUCAN) 150 MG tablet, Take 1 tablet (150 mg total) by mouth once a week., Disp: 6 tablet, Rfl: 1  Allergies  Allergen Reactions  . Latex Rash  . Tape Rash   Review of Systems: Negative except as noted in the HPI. Denies N/V/F/Ch. Objective:   There were no vitals filed for this visit. General AA&O x3. Normal mood and affect.  Vascular Dorsalis pedis and posterior tibial  pulses  present 2+ bilaterally  Capillary refill normal to all digits. Pedal hair growth normal.  Neurologic Epicritic sensation grossly present.  Dermatologic No open lesions. Interspaces clear of maceration. Nails discolored and thickened Versus with scaling plantar foot bilateral  Orthopedic: MMT 5/5 in dorsiflexion, plantarflexion, inversion, and eversion. Normal joint ROM without pain or crepitus.  Pain palpation left fourth interspace, right fourth interspace with palpable Mulder's click   Assessment & Plan:  Patient was evaluated and treated and all questions answered.  Neuroma Bilat, Calluses bilat., Onychomycosis -Switch to oral fluconazole.  Educated on self-care of calluses.  Neuromas improved.  No follow-ups on file.

## 2017-09-06 DIAGNOSIS — H401131 Primary open-angle glaucoma, bilateral, mild stage: Secondary | ICD-10-CM | POA: Diagnosis not present

## 2017-09-06 DIAGNOSIS — H01005 Unspecified blepharitis left lower eyelid: Secondary | ICD-10-CM | POA: Diagnosis not present

## 2017-09-13 ENCOUNTER — Ambulatory Visit (INDEPENDENT_AMBULATORY_CARE_PROVIDER_SITE_OTHER): Payer: Medicare HMO | Admitting: Podiatry

## 2017-09-13 ENCOUNTER — Encounter: Payer: Self-pay | Admitting: Podiatry

## 2017-09-13 DIAGNOSIS — G5762 Lesion of plantar nerve, left lower limb: Secondary | ICD-10-CM | POA: Diagnosis not present

## 2017-09-13 DIAGNOSIS — B351 Tinea unguium: Secondary | ICD-10-CM | POA: Diagnosis not present

## 2017-09-13 DIAGNOSIS — B353 Tinea pedis: Secondary | ICD-10-CM | POA: Diagnosis not present

## 2017-09-13 MED ORDER — FLUCONAZOLE 150 MG PO TABS: 150.0000 mg | ORAL_TABLET | ORAL | 1 refills | Status: DC

## 2017-09-13 NOTE — Progress Notes (Signed)
  Subjective:  Patient ID: Katrina Rocha, female    DOB: [birth year],  MRN: [id number]  Chief Complaint  Patient presents with  . Nail Problem    6wks meds follow up; pt stated, "doing good, not much itching and burning as bad, still have little pain that come and go"    72 y.o. female presents with the above complaint.  States that her nails are doing much better.  Not burning or itching.  Still has a little bit pain in the left foot   Review of Systems: Negative except as noted in the HPI. Denies N/V/F/Ch.  Past Medical History:  Diagnosis Date  . Arthritis   . GERD (gastroesophageal reflux disease)   . Glaucoma   . Glaucoma   . Glaucoma   . Glaucoma   . Heart attack ([facility])   . Hypertension   . Irritable bowel syndrome   . Sinus complaint   . Thyroid disease     Current Outpatient Medications:  .  aspirin 325 MG tablet, Take 325 mg by mouth daily., Disp: , Rfl:  .  Calcium Carbonate (CALTRATE 600 PO), Take by mouth., Disp: , Rfl:  .  fluconazole (DIFLUCAN) 150 MG tablet, Take 1 tablet (150 mg total) by mouth once a week., Disp: 6 tablet, Rfl: 1 .  ketoconazole (NIZORAL) 2 % cream, Apply 1 fingertip amount to each foot daily., Disp: 30 g, Rfl: 0 .  lisinopril (PRINIVIL,ZESTRIL) 20 MG tablet, Take 20 mg by mouth 2 (two) times daily., Disp: , Rfl:  .  MULTIPLE VITAMIN PO, Take by mouth., Disp: , Rfl:  .  omeprazole (PRILOSEC) 40 MG capsule, Take 40 mg by mouth daily., Disp: , Rfl:  .  rosuvastatin (CRESTOR) 10 MG tablet, Take 10 mg by mouth daily. Not sure of the mg/lc, Disp: , Rfl:   Social History   Tobacco Use  Smoking Status Never Smoker  Smokeless Tobacco Never Used    Allergies  Allergen Reactions  . Latex Rash  . Tape Rash   Objective:  There were no vitals filed for this visit. There is no height or weight on file to calculate BMI. Constitutional Well developed. Well nourished.  Vascular Dorsalis pedis pulses palpable bilaterally. Posterior tibial pulses  palpable bilaterally. Capillary refill normal to all digits.  No cyanosis or clubbing noted. Pedal hair growth normal.  Neurologic Normal speech. Oriented to person, place, and time. Epicritic sensation to light touch grossly present bilaterally.  Dermatologic Nails with proximal clearing noted still fungal distally No open wounds. No skin lesions.  Orthopedic: Normal joint ROM without pain or crepitus bilaterally. No visible deformities. Bilateral Mulder's click third interspace Pain to palpation left third interspace   Radiographs: None today Assessment:  No diagnosis found. Plan:  Patient was evaluated and treated and all questions answered.  Neuroma L 3rd Interspace -Injection as below.  Procedure: Neuroma Injection Location: Left 3rd interspace Skin Prep: Alcohol. Injectate: 0.5 cc 0.5% marcaine plain, 0.5 cc dexamethasone phosphate. Disposition: Patient tolerated procedure well. Injection site dressed with a band-aid.  Onychomycosis, Tinea Pedis -Improving. Refill Fluconazole for an additional 6 weeks.  Return in about 6 weeks (around 10/25/2017).

## 2017-10-06 DIAGNOSIS — J029 Acute pharyngitis, unspecified: Secondary | ICD-10-CM | POA: Diagnosis not present

## 2017-10-06 DIAGNOSIS — R0989 Other specified symptoms and signs involving the circulatory and respiratory systems: Secondary | ICD-10-CM | POA: Diagnosis not present

## 2017-10-06 DIAGNOSIS — K219 Gastro-esophageal reflux disease without esophagitis: Secondary | ICD-10-CM | POA: Diagnosis not present

## 2017-10-06 DIAGNOSIS — R05 Cough: Secondary | ICD-10-CM | POA: Diagnosis not present

## 2017-10-06 DIAGNOSIS — R635 Abnormal weight gain: Secondary | ICD-10-CM | POA: Diagnosis not present

## 2017-10-06 DIAGNOSIS — J343 Hypertrophy of nasal turbinates: Secondary | ICD-10-CM | POA: Diagnosis not present

## 2017-10-06 DIAGNOSIS — E041 Nontoxic single thyroid nodule: Secondary | ICD-10-CM | POA: Diagnosis not present

## 2017-10-25 ENCOUNTER — Ambulatory Visit (INDEPENDENT_AMBULATORY_CARE_PROVIDER_SITE_OTHER): Payer: Medicare HMO | Admitting: Podiatry

## 2017-10-25 DIAGNOSIS — G5762 Lesion of plantar nerve, left lower limb: Secondary | ICD-10-CM | POA: Diagnosis not present

## 2017-10-25 DIAGNOSIS — B353 Tinea pedis: Secondary | ICD-10-CM

## 2017-10-25 DIAGNOSIS — B351 Tinea unguium: Secondary | ICD-10-CM

## 2017-10-25 MED ORDER — FLUCONAZOLE 150 MG PO TABS: 150.0000 mg | ORAL_TABLET | ORAL | 1 refills | Status: DC

## 2017-10-25 NOTE — Progress Notes (Signed)
  Subjective:  Patient ID: Katrina Rocha, female    DOB: [birth year],  MRN: [id number]  Chief Complaint  Patient presents with  . Nail Problem    F/U tinea, med. check, and nail fungus: Pt. stated," the toenails are better. I don't have pain in them or any itching or burning." Tx: fluconazole (completed)   . Neuroma    F/U L foot neuroma Pt. stated," my foot is still very tender, the pain keeps developing."    72 y.o. female presents with the above complaint.  History as above.   Review of Systems: Negative except as noted in the HPI. Denies N/V/F/Ch.  Past Medical History:  Diagnosis Date  . Arthritis   . GERD (gastroesophageal reflux disease)   . Glaucoma   . Glaucoma   . Glaucoma   . Glaucoma   . Heart attack ([facility])   . Hypertension   . Irritable bowel syndrome   . Sinus complaint   . Thyroid disease     Current Outpatient Medications:  .  aspirin 325 MG tablet, Take 325 mg by mouth daily., Disp: , Rfl:  .  Calcium Carbonate (CALTRATE 600 PO), Take by mouth., Disp: , Rfl:  .  fluconazole (DIFLUCAN) 150 MG tablet, Take 1 tablet (150 mg total) by mouth once a week., Disp: 6 tablet, Rfl: 1 .  ketoconazole (NIZORAL) 2 % cream, Apply 1 fingertip amount to each foot daily., Disp: 30 g, Rfl: 0 .  latanoprost (XALATAN) 0.005 % ophthalmic solution, INSTILL 1 DROP INTO AFFECTED EYE(S) ONCE DAILY IN THE EVENING, Disp: , Rfl: 11 .  lisinopril (PRINIVIL,ZESTRIL) 20 MG tablet, Take 20 mg by mouth 2 (two) times daily., Disp: , Rfl:  .  MULTIPLE VITAMIN PO, Take by mouth., Disp: , Rfl:  .  omeprazole (PRILOSEC) 40 MG capsule, Take 40 mg by mouth daily., Disp: , Rfl:  .  rosuvastatin (CRESTOR) 10 MG tablet, Take 10 mg by mouth daily. Not sure of the mg/lc, Disp: , Rfl:   Social History   Tobacco Use  Smoking Status Never Smoker  Smokeless Tobacco Never Used    Allergies  Allergen Reactions  . Latex Rash  . Tape Rash   Objective:  There were no vitals filed for this  visit. There is no height or weight on file to calculate BMI. Constitutional Well developed. Well nourished.  Vascular Dorsalis pedis pulses palpable bilaterally. Posterior tibial pulses palpable bilaterally. Capillary refill normal to all digits.  No cyanosis or clubbing noted. Pedal hair growth normal.  Neurologic Normal speech. Oriented to person, place, and time. Epicritic sensation to light touch grossly present bilaterally.  Dermatologic Nails with proximal clearing noted still fungal distally No open wounds. No skin lesions.  Orthopedic: Normal joint ROM without pain or crepitus bilaterally. No visible deformities. Bilateral Mulder's click third interspace Pain to palpation left third interspace   Radiographs: None today Assessment:   1. Morton neuroma, left   2. Onychomycosis    Plan:  Patient was evaluated and treated and all questions answered.  Neuroma L 3rd Interspace -Injection #3 as below -Should pain persist discussed switching to sclerosing injection  Procedure: Neuroma Injection Location: Left 3rd interspace Skin Prep: Alcohol. Injectate: 0.5 cc 0.5% marcaine plain, 0.5 cc dexamethasone phosphate. Disposition: Patient tolerated procedure well. Injection site dressed with a band-aid.  Onychomycosis, Tinea Pedis -Improving. Patient pleased with results. Refill fluconazole one last time.  Return in about 3 weeks (around 11/15/2017) for Neuroma, Left.

## 2017-10-28 DIAGNOSIS — Z23 Encounter for immunization: Secondary | ICD-10-CM | POA: Diagnosis not present

## 2017-11-15 ENCOUNTER — Ambulatory Visit: Payer: Medicare HMO | Admitting: Podiatry

## 2017-11-15 DIAGNOSIS — B351 Tinea unguium: Secondary | ICD-10-CM

## 2017-11-15 DIAGNOSIS — G5762 Lesion of plantar nerve, left lower limb: Secondary | ICD-10-CM

## 2017-11-15 NOTE — Progress Notes (Signed)
  Subjective:  Patient ID: Katrina Rocha, female    DOB: [birth year],  MRN: [id number]  Chief Complaint  Patient presents with  . Neuroma    F/U L 3rd interspace neuroma Pt. states," it sill hurt some, it's not as bad, but I can still tell it's there; 3/10 sharp intermittent pain." tx: none  . Tinea Pedis    F/U BL feet Pt. states," doing good, feet don't itch as bas as they used to." tx: fluconazole    72 y.o. female presents with the above complaint.  History as above.  Review of Systems: Negative except as noted in the HPI. Denies N/V/F/Ch.  Past Medical History:  Diagnosis Date  . Arthritis   . GERD (gastroesophageal reflux disease)   . Glaucoma   . Glaucoma   . Glaucoma   . Glaucoma   . Heart attack ([facility])   . Hypertension   . Irritable bowel syndrome   . Sinus complaint   . Thyroid disease     Current Outpatient Medications:  .  aspirin 325 MG tablet, Take 325 mg by mouth daily., Disp: , Rfl:  .  Calcium Carbonate (CALTRATE 600 PO), Take by mouth., Disp: , Rfl:  .  fluconazole (DIFLUCAN) 150 MG tablet, Take 1 tablet (150 mg total) by mouth once a week., Disp: 6 tablet, Rfl: 1 .  ketoconazole (NIZORAL) 2 % cream, Apply 1 fingertip amount to each foot daily., Disp: 30 g, Rfl: 0 .  latanoprost (XALATAN) 0.005 % ophthalmic solution, INSTILL 1 DROP INTO AFFECTED EYE(S) ONCE DAILY IN THE EVENING, Disp: , Rfl: 11 .  lisinopril (PRINIVIL,ZESTRIL) 20 MG tablet, Take 20 mg by mouth 2 (two) times daily., Disp: , Rfl:  .  MULTIPLE VITAMIN PO, Take by mouth., Disp: , Rfl:  .  omeprazole (PRILOSEC) 40 MG capsule, Take 40 mg by mouth daily., Disp: , Rfl:  .  rosuvastatin (CRESTOR) 10 MG tablet, Take 10 mg by mouth daily. Not sure of the mg/lc, Disp: , Rfl:   Social History   Tobacco Use  Smoking Status Never Smoker  Smokeless Tobacco Never Used    Allergies  Allergen Reactions  . Latex Rash  . Tape Rash   Objective:  There were no vitals filed for this visit. There is no  height or weight on file to calculate BMI. Constitutional Well developed. Well nourished.  Vascular Dorsalis pedis pulses palpable bilaterally. Posterior tibial pulses palpable bilaterally. Capillary refill normal to all digits.  No cyanosis or clubbing noted. Pedal hair growth normal.  Neurologic Normal speech. Oriented to person, place, and time. Epicritic sensation to light touch grossly present bilaterally.  Dermatologic Nails with proximal clearing noted still fungal distally No open wounds. No skin lesions.  Orthopedic: Normal joint ROM without pain or crepitus bilaterally. No visible deformities. Bilateral Mulder's click third interspace Pain to palpation left third interspace   Radiographs: None today Assessment:   1. Morton neuroma, left   2. Onychomycosis    Plan:  Patient was evaluated and treated and all questions answered.  Neuroma L 3rd Interspace -Sclerosing Injection #1 as below  Procedure: Neurolysis Location: Left 3rd interspace Skin Prep: Alcohol. Injectate: 4% alcohol sclerosing injection. Disposition: Patient tolerated procedure well. Injection site dressed with a band-aid.    Onychomycosis, Tinea Pedis -Improving. Continue Fluconazole to completion.  Return in about 2 weeks (around 11/29/2017) for sclerosis injection.

## 2017-11-29 ENCOUNTER — Ambulatory Visit: Payer: Medicare HMO | Admitting: Podiatry

## 2017-11-29 DIAGNOSIS — G5762 Lesion of plantar nerve, left lower limb: Secondary | ICD-10-CM

## 2017-11-29 NOTE — Progress Notes (Signed)
  Subjective:  Patient ID: Katrina Rocha, female    DOB: 07/24/1945,  MRN: [id number]  Chief Complaint  Patient presents with  . Neuroma    FU L 3rd neuroma Pt. states," it's about the same, it's still stender (worst at night); 3/10."  . Tinea Pedis    F/U tinea pedis: Pt. states," they don't itch as bad, it's better." Tx: fluconazole    72 y.o. female presents with the above complaint.  History as above.  Review of Systems: Negative except as noted in the HPI. Denies N/V/F/Ch.  Past Medical History:  Diagnosis Date  . Arthritis   . GERD (gastroesophageal reflux disease)   . Glaucoma   . Glaucoma   . Glaucoma   . Glaucoma   . Heart attack ([facility])   . Hypertension   . Irritable bowel syndrome   . Sinus complaint   . Thyroid disease     Current Outpatient Medications:  .  aspirin 325 MG tablet, Take 325 mg by mouth daily., Disp: , Rfl:  .  Calcium Carbonate (CALTRATE 600 PO), Take by mouth., Disp: , Rfl:  .  fluconazole (DIFLUCAN) 150 MG tablet, Take 1 tablet (150 mg total) by mouth once a week., Disp: 6 tablet, Rfl: 1 .  ketoconazole (NIZORAL) 2 % cream, Apply 1 fingertip amount to each foot daily., Disp: 30 g, Rfl: 0 .  latanoprost (XALATAN) 0.005 % ophthalmic solution, INSTILL 1 DROP INTO AFFECTED EYE(S) ONCE DAILY IN THE EVENING, Disp: , Rfl: 11 .  lisinopril (PRINIVIL,ZESTRIL) 20 MG tablet, Take 20 mg by mouth 2 (two) times daily., Disp: , Rfl:  .  MULTIPLE VITAMIN PO, Take by mouth., Disp: , Rfl:  .  omeprazole (PRILOSEC) 40 MG capsule, Take 40 mg by mouth daily., Disp: , Rfl:  .  rosuvastatin (CRESTOR) 10 MG tablet, Take 10 mg by mouth daily. Not sure of the mg/lc, Disp: , Rfl:   Social History   Tobacco Use  Smoking Status Never Smoker  Smokeless Tobacco Never Used    Allergies  Allergen Reactions  . Latex Rash  . Tape Rash   Objective:  There were no vitals filed for this visit. There is no height or weight on file to calculate BMI. Constitutional Well  developed. Well nourished.  Vascular Dorsalis pedis pulses palpable bilaterally. Posterior tibial pulses palpable bilaterally. Capillary refill normal to all digits.  No cyanosis or clubbing noted. Pedal hair growth normal.  Neurologic Normal speech. Oriented to person, place, and time. Epicritic sensation to light touch grossly present bilaterally.  Dermatologic Nails with proximal clearing noted still fungal distally No open wounds. No skin lesions.  Orthopedic: Normal joint ROM without pain or crepitus bilaterally. No visible deformities. Bilateral Mulder's click third interspace Pain to palpation left third interspace   Radiographs: None today Assessment:   1. Morton neuroma, left    Plan:  Patient was evaluated and treated and all questions answered.  Neuroma L 3rd Interspace -Sclerosing Injection #2 as below  Procedure: Neurolysis Location: Left 3rd interspace Skin Prep: Alcohol. Injectate: 4% alcohol sclerosing injection. Disposition: Patient tolerated procedure well. Injection site dressed with a band-aid.    Return in about 2 weeks (around 12/13/2017) for Sclerosing injection .

## 2017-12-06 DIAGNOSIS — Z1231 Encounter for screening mammogram for malignant neoplasm of breast: Secondary | ICD-10-CM | POA: Diagnosis not present

## 2017-12-13 ENCOUNTER — Ambulatory Visit (INDEPENDENT_AMBULATORY_CARE_PROVIDER_SITE_OTHER): Payer: Medicare HMO | Admitting: Podiatry

## 2017-12-13 DIAGNOSIS — G5762 Lesion of plantar nerve, left lower limb: Secondary | ICD-10-CM

## 2017-12-13 NOTE — Progress Notes (Signed)
  Subjective:  Patient ID: Katrina Rocha, female    DOB: [birth year],  MRN: [id number]  Chief Complaint  Patient presents with  . Neuroma    F/U L 3rd neuroma Pt.s tates," little sore after the shot, but after that it's okay. I only feel a lot of soreness on the bottom of my foot if I'm on it a lot.'Tx: none  . Tinea Pedis    F/U tinea pedis Pt. states," the (OTC) salve that I'm using is helping a lot w/ the fungus." tx: fluconazole and (OTC) salve   72 y.o. female presents with the above complaint.  History as above.  Review of Systems: Negative except as noted in the HPI. Denies N/V/F/Ch.  Past Medical History:  Diagnosis Date  . Arthritis   . GERD (gastroesophageal reflux disease)   . Glaucoma   . Glaucoma   . Glaucoma   . Glaucoma   . Heart attack (HCC)   . Hypertension   . Irritable bowel syndrome   . Sinus complaint   . Thyroid disease     Current Outpatient Medications:  .  aspirin 325 MG tablet, Take 325 mg by mouth daily., Disp: , Rfl:  .  Calcium Carbonate (CALTRATE 600 PO), Take by mouth., Disp: , Rfl:  .  fluconazole (DIFLUCAN) 150 MG tablet, Take 1 tablet (150 mg total) by mouth once a week., Disp: 6 tablet, Rfl: 1 .  ketoconazole (NIZORAL) 2 % cream, Apply 1 fingertip amount to each foot daily., Disp: 30 g, Rfl: 0 .  latanoprost (XALATAN) 0.005 % ophthalmic solution, INSTILL 1 DROP INTO AFFECTED EYE(S) ONCE DAILY IN THE EVENING, Disp: , Rfl: 11 .  lisinopril (PRINIVIL,ZESTRIL) 20 MG tablet, Take 20 mg by mouth 2 (two) times daily., Disp: , Rfl:  .  MULTIPLE VITAMIN PO, Take by mouth., Disp: , Rfl:  .  omeprazole (PRILOSEC) 40 MG capsule, Take 40 mg by mouth daily., Disp: , Rfl:  .  rosuvastatin (CRESTOR) 10 MG tablet, Take 10 mg by mouth daily. Not sure of the mg/lc, Disp: , Rfl:   Social History   Tobacco Use  Smoking Status Never Smoker  Smokeless Tobacco Never Used    Allergies  Allergen Reactions  . Latex Rash  . Tape Rash   Objective:  There were  no vitals filed for this visit. There is no height or weight on file to calculate BMI. Constitutional Well developed. Well nourished.  Vascular Dorsalis pedis pulses palpable bilaterally. Posterior tibial pulses palpable bilaterally. Capillary refill normal to all digits.  No cyanosis or clubbing noted. Pedal hair growth normal.  Neurologic Normal speech. Oriented to person, place, and time. Epicritic sensation to light touch grossly present bilaterally.  Dermatologic Nails with proximal clearing noted still fungal distally No open wounds. No skin lesions.  Orthopedic: Normal joint ROM without pain or crepitus bilaterally. No visible deformities. Bilateral Mulder's click third interspace Pain to palpation left third interspace   Radiographs: None today Assessment:   1. Morton neuroma, left    Plan:  Patient was evaluated and treated and all questions answered.  Neuroma L 3rd Interspace -Sclerosing Injection #3 as below  Procedure: Sclerosing Nerve Injection Location: Left 3rd interspace Skin Prep: Alcohol. Injectate: 1.5 cc 4% sclerosing alcohol injection Disposition: Patient tolerated procedure well. Injection site dressed with a band-aid.  No follow-ups on file.

## 2017-12-20 DIAGNOSIS — R69 Illness, unspecified: Secondary | ICD-10-CM | POA: Diagnosis not present

## 2017-12-27 DIAGNOSIS — I10 Essential (primary) hypertension: Secondary | ICD-10-CM | POA: Diagnosis not present

## 2017-12-27 DIAGNOSIS — G459 Transient cerebral ischemic attack, unspecified: Secondary | ICD-10-CM | POA: Diagnosis not present

## 2017-12-27 DIAGNOSIS — J019 Acute sinusitis, unspecified: Secondary | ICD-10-CM | POA: Diagnosis not present

## 2017-12-27 DIAGNOSIS — E785 Hyperlipidemia, unspecified: Secondary | ICD-10-CM | POA: Diagnosis not present

## 2017-12-27 DIAGNOSIS — E042 Nontoxic multinodular goiter: Secondary | ICD-10-CM | POA: Diagnosis not present

## 2017-12-27 DIAGNOSIS — Z79899 Other long term (current) drug therapy: Secondary | ICD-10-CM | POA: Diagnosis not present

## 2017-12-27 DIAGNOSIS — M8589 Other specified disorders of bone density and structure, multiple sites: Secondary | ICD-10-CM | POA: Diagnosis not present

## 2017-12-27 DIAGNOSIS — Z0000 Encounter for general adult medical examination without abnormal findings: Secondary | ICD-10-CM | POA: Diagnosis not present

## 2017-12-27 DIAGNOSIS — Z136 Encounter for screening for cardiovascular disorders: Secondary | ICD-10-CM | POA: Diagnosis not present

## 2018-01-16 DIAGNOSIS — H401131 Primary open-angle glaucoma, bilateral, mild stage: Secondary | ICD-10-CM | POA: Diagnosis not present

## 2018-01-24 DIAGNOSIS — M8589 Other specified disorders of bone density and structure, multiple sites: Secondary | ICD-10-CM | POA: Diagnosis not present

## 2018-01-24 DIAGNOSIS — E2839 Other primary ovarian failure: Secondary | ICD-10-CM | POA: Diagnosis not present

## 2018-05-31 DIAGNOSIS — H401131 Primary open-angle glaucoma, bilateral, mild stage: Secondary | ICD-10-CM | POA: Diagnosis not present

## 2018-06-30 DIAGNOSIS — N390 Urinary tract infection, site not specified: Secondary | ICD-10-CM | POA: Diagnosis not present

## 2018-06-30 DIAGNOSIS — I10 Essential (primary) hypertension: Secondary | ICD-10-CM | POA: Diagnosis not present

## 2018-06-30 DIAGNOSIS — E042 Nontoxic multinodular goiter: Secondary | ICD-10-CM | POA: Diagnosis not present

## 2018-06-30 DIAGNOSIS — R69 Illness, unspecified: Secondary | ICD-10-CM | POA: Diagnosis not present

## 2018-06-30 DIAGNOSIS — M8589 Other specified disorders of bone density and structure, multiple sites: Secondary | ICD-10-CM | POA: Diagnosis not present

## 2018-06-30 DIAGNOSIS — Z79899 Other long term (current) drug therapy: Secondary | ICD-10-CM | POA: Diagnosis not present

## 2018-06-30 DIAGNOSIS — E785 Hyperlipidemia, unspecified: Secondary | ICD-10-CM | POA: Diagnosis not present

## 2018-07-05 DIAGNOSIS — E785 Hyperlipidemia, unspecified: Secondary | ICD-10-CM | POA: Diagnosis not present

## 2018-07-05 DIAGNOSIS — Z79899 Other long term (current) drug therapy: Secondary | ICD-10-CM | POA: Diagnosis not present

## 2018-07-05 DIAGNOSIS — E042 Nontoxic multinodular goiter: Secondary | ICD-10-CM | POA: Diagnosis not present

## 2018-09-26 DIAGNOSIS — J302 Other seasonal allergic rhinitis: Secondary | ICD-10-CM | POA: Diagnosis not present

## 2018-09-26 DIAGNOSIS — H9202 Otalgia, left ear: Secondary | ICD-10-CM | POA: Diagnosis not present

## 2018-09-26 DIAGNOSIS — Z87898 Personal history of other specified conditions: Secondary | ICD-10-CM | POA: Diagnosis not present

## 2018-09-26 DIAGNOSIS — H938X2 Other specified disorders of left ear: Secondary | ICD-10-CM | POA: Diagnosis not present

## 2018-11-10 DIAGNOSIS — Z23 Encounter for immunization: Secondary | ICD-10-CM | POA: Diagnosis not present

## 2018-12-04 DIAGNOSIS — H401131 Primary open-angle glaucoma, bilateral, mild stage: Secondary | ICD-10-CM | POA: Diagnosis not present

## 2018-12-23 DIAGNOSIS — I10 Essential (primary) hypertension: Secondary | ICD-10-CM | POA: Diagnosis not present

## 2018-12-23 DIAGNOSIS — Z803 Family history of malignant neoplasm of breast: Secondary | ICD-10-CM | POA: Diagnosis not present

## 2018-12-23 DIAGNOSIS — H409 Unspecified glaucoma: Secondary | ICD-10-CM | POA: Diagnosis not present

## 2018-12-23 DIAGNOSIS — Z7982 Long term (current) use of aspirin: Secondary | ICD-10-CM | POA: Diagnosis not present

## 2018-12-23 DIAGNOSIS — M1990 Unspecified osteoarthritis, unspecified site: Secondary | ICD-10-CM | POA: Diagnosis not present

## 2018-12-23 DIAGNOSIS — M810 Age-related osteoporosis without current pathological fracture: Secondary | ICD-10-CM | POA: Diagnosis not present

## 2018-12-23 DIAGNOSIS — K219 Gastro-esophageal reflux disease without esophagitis: Secondary | ICD-10-CM | POA: Diagnosis not present

## 2018-12-23 DIAGNOSIS — E785 Hyperlipidemia, unspecified: Secondary | ICD-10-CM | POA: Diagnosis not present

## 2018-12-23 DIAGNOSIS — J309 Allergic rhinitis, unspecified: Secondary | ICD-10-CM | POA: Diagnosis not present

## 2018-12-23 DIAGNOSIS — G8929 Other chronic pain: Secondary | ICD-10-CM | POA: Diagnosis not present

## 2019-01-01 DIAGNOSIS — Z1331 Encounter for screening for depression: Secondary | ICD-10-CM | POA: Diagnosis not present

## 2019-01-01 DIAGNOSIS — Z0000 Encounter for general adult medical examination without abnormal findings: Secondary | ICD-10-CM | POA: Diagnosis not present

## 2019-01-01 DIAGNOSIS — Z139 Encounter for screening, unspecified: Secondary | ICD-10-CM | POA: Diagnosis not present

## 2019-01-01 DIAGNOSIS — Z9181 History of falling: Secondary | ICD-10-CM | POA: Diagnosis not present

## 2019-01-01 DIAGNOSIS — Z1231 Encounter for screening mammogram for malignant neoplasm of breast: Secondary | ICD-10-CM | POA: Diagnosis not present

## 2019-01-01 DIAGNOSIS — E785 Hyperlipidemia, unspecified: Secondary | ICD-10-CM | POA: Diagnosis not present

## 2019-01-02 DIAGNOSIS — B373 Candidiasis of vulva and vagina: Secondary | ICD-10-CM | POA: Diagnosis not present

## 2019-01-02 DIAGNOSIS — I7300 Raynaud's syndrome without gangrene: Secondary | ICD-10-CM | POA: Diagnosis not present

## 2019-01-02 DIAGNOSIS — N390 Urinary tract infection, site not specified: Secondary | ICD-10-CM | POA: Diagnosis not present

## 2019-01-02 DIAGNOSIS — M8589 Other specified disorders of bone density and structure, multiple sites: Secondary | ICD-10-CM | POA: Diagnosis not present

## 2019-01-02 DIAGNOSIS — G459 Transient cerebral ischemic attack, unspecified: Secondary | ICD-10-CM | POA: Diagnosis not present

## 2019-01-02 DIAGNOSIS — I10 Essential (primary) hypertension: Secondary | ICD-10-CM | POA: Diagnosis not present

## 2019-01-02 DIAGNOSIS — E042 Nontoxic multinodular goiter: Secondary | ICD-10-CM | POA: Diagnosis not present

## 2019-01-02 DIAGNOSIS — H8113 Benign paroxysmal vertigo, bilateral: Secondary | ICD-10-CM | POA: Diagnosis not present

## 2019-01-02 DIAGNOSIS — E785 Hyperlipidemia, unspecified: Secondary | ICD-10-CM | POA: Diagnosis not present

## 2019-01-02 DIAGNOSIS — Z79899 Other long term (current) drug therapy: Secondary | ICD-10-CM | POA: Diagnosis not present

## 2019-01-23 DIAGNOSIS — Z1231 Encounter for screening mammogram for malignant neoplasm of breast: Secondary | ICD-10-CM | POA: Diagnosis not present

## 2019-01-23 DIAGNOSIS — M79605 Pain in left leg: Secondary | ICD-10-CM | POA: Diagnosis not present

## 2019-01-29 DIAGNOSIS — M5416 Radiculopathy, lumbar region: Secondary | ICD-10-CM | POA: Diagnosis not present

## 2019-02-02 DIAGNOSIS — M5416 Radiculopathy, lumbar region: Secondary | ICD-10-CM | POA: Diagnosis not present

## 2019-02-02 DIAGNOSIS — M545 Low back pain: Secondary | ICD-10-CM | POA: Diagnosis not present

## 2019-02-06 DIAGNOSIS — M5136 Other intervertebral disc degeneration, lumbar region: Secondary | ICD-10-CM | POA: Diagnosis not present

## 2019-02-06 DIAGNOSIS — M1712 Unilateral primary osteoarthritis, left knee: Secondary | ICD-10-CM | POA: Diagnosis not present

## 2019-02-09 DIAGNOSIS — Z1231 Encounter for screening mammogram for malignant neoplasm of breast: Secondary | ICD-10-CM | POA: Diagnosis not present

## 2019-02-15 DIAGNOSIS — N6011 Diffuse cystic mastopathy of right breast: Secondary | ICD-10-CM | POA: Diagnosis not present

## 2019-02-15 DIAGNOSIS — R922 Inconclusive mammogram: Secondary | ICD-10-CM | POA: Diagnosis not present

## 2019-02-27 DIAGNOSIS — H25811 Combined forms of age-related cataract, right eye: Secondary | ICD-10-CM | POA: Diagnosis not present

## 2019-02-27 DIAGNOSIS — Z01818 Encounter for other preprocedural examination: Secondary | ICD-10-CM | POA: Diagnosis not present

## 2019-02-27 DIAGNOSIS — H401131 Primary open-angle glaucoma, bilateral, mild stage: Secondary | ICD-10-CM | POA: Diagnosis not present

## 2019-03-20 DIAGNOSIS — H25811 Combined forms of age-related cataract, right eye: Secondary | ICD-10-CM | POA: Diagnosis not present

## 2019-03-20 DIAGNOSIS — H409 Unspecified glaucoma: Secondary | ICD-10-CM | POA: Diagnosis not present

## 2019-03-20 DIAGNOSIS — Z7982 Long term (current) use of aspirin: Secondary | ICD-10-CM | POA: Diagnosis not present

## 2019-03-20 DIAGNOSIS — Z8673 Personal history of transient ischemic attack (TIA), and cerebral infarction without residual deficits: Secondary | ICD-10-CM | POA: Diagnosis not present

## 2019-03-20 DIAGNOSIS — I10 Essential (primary) hypertension: Secondary | ICD-10-CM | POA: Diagnosis not present

## 2019-03-20 DIAGNOSIS — H259 Unspecified age-related cataract: Secondary | ICD-10-CM | POA: Diagnosis not present

## 2019-03-20 DIAGNOSIS — Z79899 Other long term (current) drug therapy: Secondary | ICD-10-CM | POA: Diagnosis not present

## 2019-03-20 DIAGNOSIS — K219 Gastro-esophageal reflux disease without esophagitis: Secondary | ICD-10-CM | POA: Diagnosis not present

## 2019-03-20 DIAGNOSIS — E785 Hyperlipidemia, unspecified: Secondary | ICD-10-CM | POA: Diagnosis not present

## 2019-03-20 DIAGNOSIS — I252 Old myocardial infarction: Secondary | ICD-10-CM | POA: Diagnosis not present

## 2019-04-24 DIAGNOSIS — H401131 Primary open-angle glaucoma, bilateral, mild stage: Secondary | ICD-10-CM | POA: Diagnosis not present

## 2019-04-24 DIAGNOSIS — Z7982 Long term (current) use of aspirin: Secondary | ICD-10-CM | POA: Diagnosis not present

## 2019-04-24 DIAGNOSIS — M1990 Unspecified osteoarthritis, unspecified site: Secondary | ICD-10-CM | POA: Diagnosis not present

## 2019-04-24 DIAGNOSIS — I252 Old myocardial infarction: Secondary | ICD-10-CM | POA: Diagnosis not present

## 2019-04-24 DIAGNOSIS — H259 Unspecified age-related cataract: Secondary | ICD-10-CM | POA: Diagnosis not present

## 2019-04-24 DIAGNOSIS — H25812 Combined forms of age-related cataract, left eye: Secondary | ICD-10-CM | POA: Diagnosis not present

## 2019-04-24 DIAGNOSIS — Z79899 Other long term (current) drug therapy: Secondary | ICD-10-CM | POA: Diagnosis not present

## 2019-04-24 DIAGNOSIS — G4700 Insomnia, unspecified: Secondary | ICD-10-CM | POA: Diagnosis not present

## 2019-04-24 DIAGNOSIS — I10 Essential (primary) hypertension: Secondary | ICD-10-CM | POA: Diagnosis not present

## 2019-04-24 DIAGNOSIS — E785 Hyperlipidemia, unspecified: Secondary | ICD-10-CM | POA: Diagnosis not present

## 2019-04-24 DIAGNOSIS — K219 Gastro-esophageal reflux disease without esophagitis: Secondary | ICD-10-CM | POA: Diagnosis not present

## 2019-05-08 DIAGNOSIS — M1712 Unilateral primary osteoarthritis, left knee: Secondary | ICD-10-CM | POA: Diagnosis not present

## 2019-05-08 DIAGNOSIS — M5136 Other intervertebral disc degeneration, lumbar region: Secondary | ICD-10-CM | POA: Diagnosis not present

## 2019-06-05 DIAGNOSIS — J309 Allergic rhinitis, unspecified: Secondary | ICD-10-CM | POA: Diagnosis not present

## 2019-06-05 DIAGNOSIS — H409 Unspecified glaucoma: Secondary | ICD-10-CM | POA: Diagnosis not present

## 2019-06-05 DIAGNOSIS — Z791 Long term (current) use of non-steroidal anti-inflammatories (NSAID): Secondary | ICD-10-CM | POA: Diagnosis not present

## 2019-06-05 DIAGNOSIS — Z008 Encounter for other general examination: Secondary | ICD-10-CM | POA: Diagnosis not present

## 2019-06-05 DIAGNOSIS — G8929 Other chronic pain: Secondary | ICD-10-CM | POA: Diagnosis not present

## 2019-06-05 DIAGNOSIS — E785 Hyperlipidemia, unspecified: Secondary | ICD-10-CM | POA: Diagnosis not present

## 2019-06-05 DIAGNOSIS — R42 Dizziness and giddiness: Secondary | ICD-10-CM | POA: Diagnosis not present

## 2019-06-05 DIAGNOSIS — Z8249 Family history of ischemic heart disease and other diseases of the circulatory system: Secondary | ICD-10-CM | POA: Diagnosis not present

## 2019-06-05 DIAGNOSIS — K219 Gastro-esophageal reflux disease without esophagitis: Secondary | ICD-10-CM | POA: Diagnosis not present

## 2019-06-05 DIAGNOSIS — I10 Essential (primary) hypertension: Secondary | ICD-10-CM | POA: Diagnosis not present

## 2019-06-05 DIAGNOSIS — Z7982 Long term (current) use of aspirin: Secondary | ICD-10-CM | POA: Diagnosis not present

## 2019-07-03 DIAGNOSIS — E042 Nontoxic multinodular goiter: Secondary | ICD-10-CM | POA: Diagnosis not present

## 2019-07-03 DIAGNOSIS — M8589 Other specified disorders of bone density and structure, multiple sites: Secondary | ICD-10-CM | POA: Diagnosis not present

## 2019-07-03 DIAGNOSIS — E785 Hyperlipidemia, unspecified: Secondary | ICD-10-CM | POA: Diagnosis not present

## 2019-07-03 DIAGNOSIS — Z79899 Other long term (current) drug therapy: Secondary | ICD-10-CM | POA: Diagnosis not present

## 2019-07-03 DIAGNOSIS — Z1211 Encounter for screening for malignant neoplasm of colon: Secondary | ICD-10-CM | POA: Diagnosis not present

## 2019-07-03 DIAGNOSIS — Z8673 Personal history of transient ischemic attack (TIA), and cerebral infarction without residual deficits: Secondary | ICD-10-CM | POA: Diagnosis not present

## 2019-07-03 DIAGNOSIS — I10 Essential (primary) hypertension: Secondary | ICD-10-CM | POA: Diagnosis not present

## 2019-07-11 DIAGNOSIS — R350 Frequency of micturition: Secondary | ICD-10-CM | POA: Diagnosis not present

## 2019-08-31 ENCOUNTER — Telehealth: Payer: Self-pay | Admitting: Gastroenterology

## 2019-08-31 NOTE — Telephone Encounter (Signed)
Hey Dr Chales Abrahams, this pt states she had a colonoscopy done 3-4 years ago with you in [place], she is being referred to [place] to have another one done, please advise on scheduling

## 2019-09-02 NOTE — Telephone Encounter (Signed)
Gloria/Trish, Can you please get report of last colonoscopy RG

## 2019-09-03 NOTE — Telephone Encounter (Signed)
I have put this on your desk for review.  

## 2019-09-23 NOTE — Telephone Encounter (Signed)
Have reviewed her colonoscopy from 10/27/2016 ([facility])-mild sigmoid diverticulosis, internal hemorrhoids (likely etiology of rectal bleeding, no active bleeding.)  Otherwise normal colonoscopy.  Plan: -Hold off on repeating screening colonoscopy d/t advanced age. -Follow-up only if with any GI problems. -Otherwise continue using Preparation H on a as needed basis if needed.  RG

## 2019-09-24 NOTE — Telephone Encounter (Signed)
I have called and left the patient a voicemail to return my call.

## 2019-09-27 NOTE — Telephone Encounter (Signed)
I have called and left message for patient to return my call.  

## 2019-10-01 NOTE — Telephone Encounter (Signed)
I have called and left a message for patient to return my call.  

## 2019-11-27 DIAGNOSIS — Z9889 Other specified postprocedural states: Secondary | ICD-10-CM | POA: Diagnosis not present

## 2019-11-27 DIAGNOSIS — Q385 Congenital malformations of palate, not elsewhere classified: Secondary | ICD-10-CM | POA: Diagnosis not present

## 2019-11-27 DIAGNOSIS — E041 Nontoxic single thyroid nodule: Secondary | ICD-10-CM | POA: Diagnosis not present

## 2019-11-27 DIAGNOSIS — R0981 Nasal congestion: Secondary | ICD-10-CM | POA: Diagnosis not present

## 2019-11-27 DIAGNOSIS — K219 Gastro-esophageal reflux disease without esophagitis: Secondary | ICD-10-CM | POA: Diagnosis not present

## 2019-11-27 DIAGNOSIS — R059 Cough, unspecified: Secondary | ICD-10-CM | POA: Diagnosis not present

## 2019-11-27 DIAGNOSIS — J343 Hypertrophy of nasal turbinates: Secondary | ICD-10-CM | POA: Diagnosis not present

## 2019-11-27 DIAGNOSIS — J342 Deviated nasal septum: Secondary | ICD-10-CM | POA: Diagnosis not present

## 2019-12-03 DIAGNOSIS — H5213 Myopia, bilateral: Secondary | ICD-10-CM | POA: Diagnosis not present

## 2019-12-03 DIAGNOSIS — H04123 Dry eye syndrome of bilateral lacrimal glands: Secondary | ICD-10-CM | POA: Diagnosis not present

## 2019-12-08 DIAGNOSIS — H52209 Unspecified astigmatism, unspecified eye: Secondary | ICD-10-CM | POA: Diagnosis not present

## 2019-12-08 DIAGNOSIS — H5213 Myopia, bilateral: Secondary | ICD-10-CM | POA: Diagnosis not present

## 2019-12-08 DIAGNOSIS — H524 Presbyopia: Secondary | ICD-10-CM | POA: Diagnosis not present

## 2019-12-11 DIAGNOSIS — E041 Nontoxic single thyroid nodule: Secondary | ICD-10-CM | POA: Diagnosis not present

## 2019-12-13 DIAGNOSIS — N3000 Acute cystitis without hematuria: Secondary | ICD-10-CM | POA: Diagnosis not present

## 2019-12-13 DIAGNOSIS — R8271 Bacteriuria: Secondary | ICD-10-CM | POA: Diagnosis not present

## 2019-12-19 DIAGNOSIS — E042 Nontoxic multinodular goiter: Secondary | ICD-10-CM | POA: Diagnosis not present

## 2020-01-02 DIAGNOSIS — M8589 Other specified disorders of bone density and structure, multiple sites: Secondary | ICD-10-CM | POA: Diagnosis not present

## 2020-01-02 DIAGNOSIS — I10 Essential (primary) hypertension: Secondary | ICD-10-CM | POA: Diagnosis not present

## 2020-01-02 DIAGNOSIS — H8113 Benign paroxysmal vertigo, bilateral: Secondary | ICD-10-CM | POA: Diagnosis not present

## 2020-01-02 DIAGNOSIS — E785 Hyperlipidemia, unspecified: Secondary | ICD-10-CM | POA: Diagnosis not present

## 2020-01-02 DIAGNOSIS — Z79899 Other long term (current) drug therapy: Secondary | ICD-10-CM | POA: Diagnosis not present

## 2020-01-02 DIAGNOSIS — Z1231 Encounter for screening mammogram for malignant neoplasm of breast: Secondary | ICD-10-CM | POA: Diagnosis not present

## 2020-01-02 DIAGNOSIS — Z8673 Personal history of transient ischemic attack (TIA), and cerebral infarction without residual deficits: Secondary | ICD-10-CM | POA: Diagnosis not present

## 2020-01-02 DIAGNOSIS — E042 Nontoxic multinodular goiter: Secondary | ICD-10-CM | POA: Diagnosis not present

## 2020-01-03 DIAGNOSIS — Z1331 Encounter for screening for depression: Secondary | ICD-10-CM | POA: Diagnosis not present

## 2020-01-03 DIAGNOSIS — E785 Hyperlipidemia, unspecified: Secondary | ICD-10-CM | POA: Diagnosis not present

## 2020-01-03 DIAGNOSIS — Z9181 History of falling: Secondary | ICD-10-CM | POA: Diagnosis not present

## 2020-01-03 DIAGNOSIS — Z0000 Encounter for general adult medical examination without abnormal findings: Secondary | ICD-10-CM | POA: Diagnosis not present

## 2020-01-10 DIAGNOSIS — N3000 Acute cystitis without hematuria: Secondary | ICD-10-CM | POA: Diagnosis not present

## 2020-01-17 DIAGNOSIS — R42 Dizziness and giddiness: Secondary | ICD-10-CM | POA: Diagnosis not present

## 2020-01-17 DIAGNOSIS — H8110 Benign paroxysmal vertigo, unspecified ear: Secondary | ICD-10-CM | POA: Diagnosis not present

## 2020-02-18 DIAGNOSIS — M85851 Other specified disorders of bone density and structure, right thigh: Secondary | ICD-10-CM | POA: Diagnosis not present

## 2020-02-18 DIAGNOSIS — Z1231 Encounter for screening mammogram for malignant neoplasm of breast: Secondary | ICD-10-CM | POA: Diagnosis not present

## 2020-02-18 DIAGNOSIS — N959 Unspecified menopausal and perimenopausal disorder: Secondary | ICD-10-CM | POA: Diagnosis not present

## 2020-06-02 DIAGNOSIS — H401131 Primary open-angle glaucoma, bilateral, mild stage: Secondary | ICD-10-CM | POA: Diagnosis not present

## 2020-06-04 DIAGNOSIS — I10 Essential (primary) hypertension: Secondary | ICD-10-CM | POA: Diagnosis not present

## 2020-06-04 DIAGNOSIS — K219 Gastro-esophageal reflux disease without esophagitis: Secondary | ICD-10-CM | POA: Diagnosis not present

## 2020-06-04 DIAGNOSIS — Z7722 Contact with and (suspected) exposure to environmental tobacco smoke (acute) (chronic): Secondary | ICD-10-CM | POA: Diagnosis not present

## 2020-06-04 DIAGNOSIS — J309 Allergic rhinitis, unspecified: Secondary | ICD-10-CM | POA: Diagnosis not present

## 2020-06-04 DIAGNOSIS — Z8249 Family history of ischemic heart disease and other diseases of the circulatory system: Secondary | ICD-10-CM | POA: Diagnosis not present

## 2020-06-04 DIAGNOSIS — R42 Dizziness and giddiness: Secondary | ICD-10-CM | POA: Diagnosis not present

## 2020-06-04 DIAGNOSIS — Z7982 Long term (current) use of aspirin: Secondary | ICD-10-CM | POA: Diagnosis not present

## 2020-06-04 DIAGNOSIS — Z823 Family history of stroke: Secondary | ICD-10-CM | POA: Diagnosis not present

## 2020-06-04 DIAGNOSIS — Z803 Family history of malignant neoplasm of breast: Secondary | ICD-10-CM | POA: Diagnosis not present

## 2020-06-04 DIAGNOSIS — E785 Hyperlipidemia, unspecified: Secondary | ICD-10-CM | POA: Diagnosis not present

## 2020-06-08 DIAGNOSIS — I10 Essential (primary) hypertension: Secondary | ICD-10-CM | POA: Diagnosis not present

## 2020-06-08 DIAGNOSIS — Z79899 Other long term (current) drug therapy: Secondary | ICD-10-CM | POA: Diagnosis not present

## 2020-06-08 DIAGNOSIS — R519 Headache, unspecified: Secondary | ICD-10-CM | POA: Diagnosis not present

## 2020-06-08 DIAGNOSIS — H538 Other visual disturbances: Secondary | ICD-10-CM | POA: Diagnosis not present

## 2020-06-08 DIAGNOSIS — R112 Nausea with vomiting, unspecified: Secondary | ICD-10-CM | POA: Diagnosis not present

## 2020-06-08 DIAGNOSIS — R42 Dizziness and giddiness: Secondary | ICD-10-CM | POA: Diagnosis not present

## 2020-07-02 DIAGNOSIS — E785 Hyperlipidemia, unspecified: Secondary | ICD-10-CM | POA: Diagnosis not present

## 2020-07-02 DIAGNOSIS — Z8673 Personal history of transient ischemic attack (TIA), and cerebral infarction without residual deficits: Secondary | ICD-10-CM | POA: Diagnosis not present

## 2020-07-02 DIAGNOSIS — Z139 Encounter for screening, unspecified: Secondary | ICD-10-CM | POA: Diagnosis not present

## 2020-07-02 DIAGNOSIS — M8589 Other specified disorders of bone density and structure, multiple sites: Secondary | ICD-10-CM | POA: Diagnosis not present

## 2020-07-02 DIAGNOSIS — E042 Nontoxic multinodular goiter: Secondary | ICD-10-CM | POA: Diagnosis not present

## 2020-07-02 DIAGNOSIS — I10 Essential (primary) hypertension: Secondary | ICD-10-CM | POA: Diagnosis not present

## 2020-07-02 DIAGNOSIS — Z79899 Other long term (current) drug therapy: Secondary | ICD-10-CM | POA: Diagnosis not present

## 2020-08-19 DIAGNOSIS — H0288A Meibomian gland dysfunction right eye, upper and lower eyelids: Secondary | ICD-10-CM | POA: Diagnosis not present

## 2020-08-19 DIAGNOSIS — H0288B Meibomian gland dysfunction left eye, upper and lower eyelids: Secondary | ICD-10-CM | POA: Diagnosis not present

## 2020-09-01 DIAGNOSIS — H0288A Meibomian gland dysfunction right eye, upper and lower eyelids: Secondary | ICD-10-CM | POA: Diagnosis not present

## 2020-09-01 DIAGNOSIS — H0288B Meibomian gland dysfunction left eye, upper and lower eyelids: Secondary | ICD-10-CM | POA: Diagnosis not present

## 2021-01-01 ENCOUNTER — Encounter: Payer: Self-pay | Admitting: Gastroenterology

## 2021-01-01 DIAGNOSIS — I10 Essential (primary) hypertension: Secondary | ICD-10-CM | POA: Diagnosis not present

## 2021-01-01 DIAGNOSIS — Z79899 Other long term (current) drug therapy: Secondary | ICD-10-CM | POA: Diagnosis not present

## 2021-01-01 DIAGNOSIS — Z8673 Personal history of transient ischemic attack (TIA), and cerebral infarction without residual deficits: Secondary | ICD-10-CM | POA: Diagnosis not present

## 2021-01-01 DIAGNOSIS — E785 Hyperlipidemia, unspecified: Secondary | ICD-10-CM | POA: Diagnosis not present

## 2021-01-01 DIAGNOSIS — E042 Nontoxic multinodular goiter: Secondary | ICD-10-CM | POA: Diagnosis not present

## 2021-01-01 DIAGNOSIS — M8589 Other specified disorders of bone density and structure, multiple sites: Secondary | ICD-10-CM | POA: Diagnosis not present

## 2021-01-08 DIAGNOSIS — Z1331 Encounter for screening for depression: Secondary | ICD-10-CM | POA: Diagnosis not present

## 2021-01-08 DIAGNOSIS — Z0000 Encounter for general adult medical examination without abnormal findings: Secondary | ICD-10-CM | POA: Diagnosis not present

## 2021-01-08 DIAGNOSIS — E785 Hyperlipidemia, unspecified: Secondary | ICD-10-CM | POA: Diagnosis not present

## 2021-01-08 DIAGNOSIS — Z9181 History of falling: Secondary | ICD-10-CM | POA: Diagnosis not present

## 2021-02-10 ENCOUNTER — Other Ambulatory Visit: Payer: Self-pay

## 2021-02-10 ENCOUNTER — Ambulatory Visit (INDEPENDENT_AMBULATORY_CARE_PROVIDER_SITE_OTHER): Payer: Self-pay | Admitting: Gastroenterology

## 2021-02-10 ENCOUNTER — Other Ambulatory Visit (INDEPENDENT_AMBULATORY_CARE_PROVIDER_SITE_OTHER): Payer: Medicare HMO

## 2021-02-10 ENCOUNTER — Encounter: Payer: Self-pay | Admitting: Gastroenterology

## 2021-02-10 VITALS — BP 146/92 | HR 70 | Ht 64.0 in | Wt 169.0 lb

## 2021-02-10 DIAGNOSIS — R1030 Lower abdominal pain, unspecified: Secondary | ICD-10-CM

## 2021-02-10 DIAGNOSIS — R194 Change in bowel habit: Secondary | ICD-10-CM

## 2021-02-10 LAB — COMPREHENSIVE METABOLIC PANEL
ALT: 12 U/L (ref 0–35)
AST: 16 U/L (ref 0–37)
Albumin: 4.2 g/dL (ref 3.5–5.2)
Alkaline Phosphatase: 64 U/L (ref 39–117)
BUN: 15 mg/dL (ref 6–23)
CO2: 31 mEq/L (ref 19–32)
Calcium: 9.2 mg/dL (ref 8.4–10.5)
Chloride: 106 mEq/L (ref 96–112)
Creatinine, Ser: 0.66 mg/dL (ref 0.40–1.20)
GFR: 85.41 mL/min (ref >60.00)
Glucose, Bld: 86 mg/dL (ref 70–99)
Potassium: 4 mEq/L (ref 3.5–5.1)
Sodium: 142 mEq/L (ref 135–145)
Total Bilirubin: 0.5 mg/dL (ref 0.2–1.2)
Total Protein: 7.3 g/dL (ref 6.0–8.3)

## 2021-02-10 LAB — C-REACTIVE PROTEIN: CRP: <1 mg/dL (ref 0.5–20.0)

## 2021-02-10 LAB — TSH: TSH: 1.08 u[IU]/mL (ref 0.35–5.50)

## 2021-02-10 NOTE — Progress Notes (Signed)
Chief Complaint: Lower abdominal pain  Referring Provider:  Nicoletta Dress, MD      ASSESSMENT AND PLAN;   #1. Lower abdo pain  #2. Change in BM. Prev IBS-D. Now with occ constipation  #3. FH of pancreatic CA   Plan: -CBC, CMP, CRP, celiac -CT AP with contrast -Can add low dose Mg  -Call if still with problems.   HPI:    Katrina Rocha is a 76 y.o. female  With BPPV, IBS-D, lactose intolerance, HTN, OA, S/P partial thyroidectomy  With lower abdominal pain x 2 to 3 months, getting worse.  Nonradiating, intermittent, partially relieved by defecation. Mild constipation and sometimes bowels " do not move as freely as before" No melena or hematochezia No recent weight loss  Denies having any upper GI symptoms including nausea, vomiting, heartburn, regurgitation, odynophagia or dysphagia.  No fever chills or night sweats.  Has history of lactose intolerance.  Very much concerned about cancers.  Per patient last TSH was normal  Wt Readings from Last 3 Encounters:  02/10/21 169 lb (76.7 kg)  06/21/17 160 lb (72.6 kg)  01/25/13 148 lb (67.1 kg)    Past GI procedures: Colonoscopy from 10/27/2016 ([facility])-mild sigmoid diverticulosis, internal hemorrhoids (likely etiology of rectal bleeding, no active bleeding.)  Otherwise normal colonoscopy.  No need to repeat unless problems  EGD with Dil 05/2011: -Schatzki's ring s/p dil 48Fr -Small HH -Neg CLO  FH of pancreatic CA (mom passed away at age 49) Past Medical History:  Diagnosis Date   Arthritis    Fibrocystic breast disease    GERD (gastroesophageal reflux disease)    Glaucoma    Heart attack ([place])    Hypertension    Irritable bowel syndrome    Lactose intolerance    Rectal incontinence    Sinus complaint    Thyroid disease    Transient cerebral ischemia     Past Surgical History:  Procedure Laterality Date   ABDOMINAL HYSTERECTOMY     BREAST SURGERY     CATARACT EXTRACTION Bilateral    March and  April 2022   COLONOSCOPY  10/27/2016   Mild sigmoid doverticulosis. Internal hemorrhoids (likely etiology or rectal bleeding-no active bleeding) Otherwise normal colonoscopy   ESOPHAGOGASTRODUODENOSCOPY  05/17/2011   Schatzki ring, status post esophageal dilatation. Small hiatal hernia   FOOT SURGERY Right    THYROID SURGERY  12/05/2012   torn tendon surgery      Family History  Problem Relation Age of Onset   Cancer Mother    Arthritis Mother    Pancreatic cancer Mother    Heart disease Father    Glaucoma Father    Colon cancer Neg Hx    Esophageal cancer Neg Hx     Social History   Tobacco Use   Smoking status: Never   Smokeless tobacco: Never  Vaping Use   Vaping Use: Never used  Substance Use Topics   Alcohol use: No   Drug use: No    Current Outpatient Medications  Medication Sig Dispense Refill   aspirin 325 MG tablet Take 325 mg by mouth daily.     CALCIUM PO Take 2 tablets by mouth daily in the afternoon. 2 gummies     fexofenadine (ALLEGRA) 180 MG tablet Take 180 mg by mouth daily.     lisinopril (PRINIVIL,ZESTRIL) 20 MG tablet Take 20 mg by mouth 2 (two) times daily.     MULTIPLE VITAMIN PO Take 2 tablets by mouth daily in the  afternoon. 2 gummies a day     omeprazole (PRILOSEC) 40 MG capsule Take 40 mg by mouth daily.     rosuvastatin (CRESTOR) 10 MG tablet Take 10 mg by mouth daily. Not sure of the mg/lc     latanoprost (XALATAN) 0.005 % ophthalmic solution INSTILL 1 DROP INTO AFFECTED EYE(S) ONCE DAILY IN THE EVENING (Patient not taking: Reported on 02/10/2021)  11   No current facility-administered medications for this visit.    Allergies  Allergen Reactions   Latex Rash   Tape Rash    Review of Systems:  Constitutional: Denies fever, chills, diaphoresis, appetite change and fatigue.  HEENT: Denies photophobia, eye pain, redness, hearing loss, ear pain, congestion, sore throat, rhinorrhea, sneezing, mouth sores, neck pain, neck stiffness and  tinnitus.   Respiratory: Denies SOB, DOE, cough, chest tightness,  and wheezing.   Cardiovascular: Denies chest pain, palpitations and leg swelling.  Genitourinary: Denies dysuria, urgency, frequency, hematuria, flank pain and difficulty urinating.  Musculoskeletal: Denies myalgias, back pain, joint swelling, arthralgias and gait problem.  Skin: No rash.  Neurological: Denies dizziness, seizures, syncope, weakness, light-headedness, numbness and headaches.  Hematological: Denies adenopathy. Easy bruising, personal or family bleeding history  Psychiatric/Behavioral: No anxiety or depression     Physical Exam:    BP (!) 146/92    Pulse 70    Ht 5\' 4"  (1.626 m)    Wt 169 lb (76.7 kg)    BMI 29.01 kg/m  Wt Readings from Last 3 Encounters:  02/10/21 169 lb (76.7 kg)  06/21/17 160 lb (72.6 kg)  01/25/13 148 lb (67.1 kg)   Constitutional:  Well-developed, in no acute distress. Psychiatric: Normal mood and affect. Behavior is normal. HEENT: Pupils normal.  Conjunctivae are normal. No scleral icterus. Neck supple.  Well-healed surgical scar. Cardiovascular: Normal rate, regular rhythm. No edema Pulmonary/chest: Effort normal and breath sounds normal. No wheezing, rales or rhonchi. Abdominal: Soft, nondistended.  Mild lower abdominal tenderness without rebound.. Bowel sounds active throughout. There are no masses palpable. No hepatomegaly. Rectal: Deferred Neurological: Alert and oriented to person place and time. Skin: Skin is warm and dry. No rashes noted.  Data Reviewed: I have personally reviewed following labs and imaging studies    Carmell Austria, MD 02/10/2021, 9:13 AM  Cc: Nicoletta Dress, MD

## 2021-02-10 NOTE — Patient Instructions (Addendum)
If you are age 77 or older, your body mass index should be between 23-30. Your Body mass index is 29.01 kg/m. If this is out of the aforementioned range listed, please consider follow up with your Primary Care Provider. __________________________________________________________  The [facility] providers would like to encourage you to use [facility] to communicate with providers for non-urgent requests or questions.  Due to long hold times on the telephone, sending your provider a message by [facility] may be a faster and more efficient way to get a response.  Please allow 48 business hours for a response.  Please remember that this is for non-urgent requests.   Due to recent changes in healthcare laws, you may see the results of your imaging and laboratory studies on MyChart before your provider has had a chance to review them.  We understand that in some cases there may be results that are confusing or concerning to you. Not all laboratory results come back in the same time frame and the provider may be waiting for multiple results in order to interpret others.  Please give [place] 48 hours in order for your provider to thoroughly review all the results before contacting the office for clarification of your results.    Please go to the lab on the 2nd floor [address] before you leave the office today.    You have been scheduled for a CT scan of the abdomen and pelvis at [facility] [place] [address] [address]).   You are scheduled on 02/16/21 at 7:30 am. You should arrive 15 minutes prior to your appointment time for registration. Please follow the written instructions below on the day of your exam:  WARNING: IF YOU ARE ALLERGIC TO IODINE/X-RAY DYE, PLEASE NOTIFY RADIOLOGY IMMEDIATELY AT [phone]! YOU WILL BE GIVEN A 13 HOUR PREMEDICATION PREP.  1) Do not eat or drink anything after 3:30a, (4 hours prior to your test) 2) You have been given 2 bottles of oral  contrast to drink. The solution may taste better if refrigerated, but do NOT add ice or any other liquid to this solution. Shake well before drinking.    Drink 1 bottle of contrast @ 5:30 am(2 hours prior to your exam)  Drink 1 bottle of contrast @ 6:30 am (1 hour prior to your exam)  You may take any medications as prescribed with a small amount of water, if necessary. If you take any of the following medications: METFORMIN, GLUCOPHAGE, GLUCOVANCE, AVANDAMET, RIOMET, FORTAMET, [place] MET, JANUMET, GLUMETZA or METAGLIP, you MAY be asked to HOLD this medication 48 hours AFTER the exam.  The purpose of you drinking the oral contrast is to aid in the visualization of your intestinal tract. The contrast solution may cause some diarrhea. Depending on your individual set of symptoms, you may also receive an intravenous injection of x-ray contrast/dye. Plan on being at [facility] for 30 minutes or longer, depending on the type of exam you are having performed.  This test typically takes 30-45 minutes to complete.  If you have any questions regarding your exam or if you need to reschedule, you may call the CT department at [phone] between the hours of 8:00 am and 5:00 pm, Monday-Friday. ________________________________________________________________________   Add low dose Magnesium. Follow up as needed.   It was a pleasure to see you today!  Jackquline Denmark, M.D.

## 2021-02-11 DIAGNOSIS — R8271 Bacteriuria: Secondary | ICD-10-CM | POA: Diagnosis not present

## 2021-02-11 DIAGNOSIS — N3000 Acute cystitis without hematuria: Secondary | ICD-10-CM | POA: Diagnosis not present

## 2021-02-12 LAB — CBC WITH DIFFERENTIAL
Basophils Absolute: 0 10*3/uL (ref 0.0–0.2)
Basos: 1 %
EOS (ABSOLUTE): 0.1 10*3/uL (ref 0.0–0.4)
Eos: 2 %
Hematocrit: 40.2 % (ref 34.0–46.6)
Hemoglobin: 13.6 g/dL (ref 11.1–15.9)
Immature Grans (Abs): 0 10*3/uL (ref 0.0–0.1)
Immature Granulocytes: 0 %
Lymphocytes Absolute: 1.2 10*3/uL (ref 0.7–3.1)
Lymphs: 33 %
MCH: 30.7 pg (ref 26.6–33.0)
MCHC: 33.8 g/dL (ref 31.5–35.7)
MCV: 91 fL (ref 79–97)
Monocytes Absolute: 0.3 10*3/uL (ref 0.1–0.9)
Monocytes: 8 %
Neutrophils Absolute: 2.2 10*3/uL (ref 1.4–7.0)
Neutrophils: 56 %
RBC: 4.43 x10E6/uL (ref 3.77–5.28)
RDW: 13.1 % (ref 11.7–15.4)
WBC: 3.8 10*3/uL (ref 3.4–10.8)

## 2021-02-12 LAB — CELIAC PANEL 10
Antigliadin Abs, IgA: 4 units (ref 0–19)
Endomysial IgA: NEGATIVE
Gliadin IgG: 2 units (ref 0–19)
IgA/Immunoglobulin A, Serum: 367 mg/dL (ref 64–422)
Tissue Transglut Ab: <2 U/mL (ref 0–5)
Transglutaminase IgA: <2 U/mL (ref 0–3)

## 2021-02-16 ENCOUNTER — Other Ambulatory Visit: Payer: Self-pay

## 2021-02-16 ENCOUNTER — Encounter (HOSPITAL_BASED_OUTPATIENT_CLINIC_OR_DEPARTMENT_OTHER): Payer: Self-pay

## 2021-02-16 ENCOUNTER — Ambulatory Visit (HOSPITAL_BASED_OUTPATIENT_CLINIC_OR_DEPARTMENT_OTHER)
Admission: RE | Admit: 2021-02-16 | Discharge: 2021-02-16 | Disposition: A | Discharge status: Home / Self Care | Payer: Medicare HMO | Source: Ambulatory Visit | Attending: Gastroenterology | Admitting: Gastroenterology

## 2021-02-16 DIAGNOSIS — I700 Atherosclerosis of aorta: Secondary | ICD-10-CM | POA: Diagnosis not present

## 2021-02-16 DIAGNOSIS — R194 Change in bowel habit: Secondary | ICD-10-CM | POA: Diagnosis not present

## 2021-02-16 DIAGNOSIS — R109 Unspecified abdominal pain: Secondary | ICD-10-CM | POA: Diagnosis not present

## 2021-02-16 DIAGNOSIS — R1030 Lower abdominal pain, unspecified: Secondary | ICD-10-CM | POA: Diagnosis not present

## 2021-02-16 MED ORDER — IOHEXOL 300 MG/ML  SOLN
100.0000 mL | Freq: Once | INTRAMUSCULAR | Status: AC | PRN
  Administered 2021-02-16: 100 mL via INTRAVENOUS

## 2021-02-18 DIAGNOSIS — Z1231 Encounter for screening mammogram for malignant neoplasm of breast: Secondary | ICD-10-CM | POA: Diagnosis not present

## 2021-03-02 DIAGNOSIS — H0288B Meibomian gland dysfunction left eye, upper and lower eyelids: Secondary | ICD-10-CM | POA: Diagnosis not present

## 2021-03-02 DIAGNOSIS — H401131 Primary open-angle glaucoma, bilateral, mild stage: Secondary | ICD-10-CM | POA: Diagnosis not present

## 2021-03-02 DIAGNOSIS — H0288A Meibomian gland dysfunction right eye, upper and lower eyelids: Secondary | ICD-10-CM | POA: Diagnosis not present

## 2021-03-22 DIAGNOSIS — Z8673 Personal history of transient ischemic attack (TIA), and cerebral infarction without residual deficits: Secondary | ICD-10-CM | POA: Diagnosis not present

## 2021-03-22 DIAGNOSIS — Z91040 Latex allergy status: Secondary | ICD-10-CM | POA: Diagnosis not present

## 2021-03-22 DIAGNOSIS — Z008 Encounter for other general examination: Secondary | ICD-10-CM | POA: Diagnosis not present

## 2021-03-22 DIAGNOSIS — Z833 Family history of diabetes mellitus: Secondary | ICD-10-CM | POA: Diagnosis not present

## 2021-03-22 DIAGNOSIS — Z8249 Family history of ischemic heart disease and other diseases of the circulatory system: Secondary | ICD-10-CM | POA: Diagnosis not present

## 2021-03-22 DIAGNOSIS — K219 Gastro-esophageal reflux disease without esophagitis: Secondary | ICD-10-CM | POA: Diagnosis not present

## 2021-03-22 DIAGNOSIS — I10 Essential (primary) hypertension: Secondary | ICD-10-CM | POA: Diagnosis not present

## 2021-03-22 DIAGNOSIS — M1990 Unspecified osteoarthritis, unspecified site: Secondary | ICD-10-CM | POA: Diagnosis not present

## 2021-03-22 DIAGNOSIS — Z7982 Long term (current) use of aspirin: Secondary | ICD-10-CM | POA: Diagnosis not present

## 2021-03-22 DIAGNOSIS — K08409 Partial loss of teeth, unspecified cause, unspecified class: Secondary | ICD-10-CM | POA: Diagnosis not present

## 2021-03-22 DIAGNOSIS — E785 Hyperlipidemia, unspecified: Secondary | ICD-10-CM | POA: Diagnosis not present

## 2021-04-14 DIAGNOSIS — M5136 Other intervertebral disc degeneration, lumbar region: Secondary | ICD-10-CM | POA: Diagnosis not present

## 2021-04-14 DIAGNOSIS — M5416 Radiculopathy, lumbar region: Secondary | ICD-10-CM | POA: Diagnosis not present

## 2021-04-14 DIAGNOSIS — M545 Low back pain, unspecified: Secondary | ICD-10-CM | POA: Diagnosis not present

## 2021-04-14 DIAGNOSIS — M5137 Other intervertebral disc degeneration, lumbosacral region: Secondary | ICD-10-CM | POA: Diagnosis not present

## 2021-04-16 DIAGNOSIS — H9319 Tinnitus, unspecified ear: Secondary | ICD-10-CM | POA: Diagnosis not present

## 2021-04-16 DIAGNOSIS — J343 Hypertrophy of nasal turbinates: Secondary | ICD-10-CM | POA: Diagnosis not present

## 2021-04-16 DIAGNOSIS — R059 Cough, unspecified: Secondary | ICD-10-CM | POA: Diagnosis not present

## 2021-04-16 DIAGNOSIS — R0989 Other specified symptoms and signs involving the circulatory and respiratory systems: Secondary | ICD-10-CM | POA: Diagnosis not present

## 2021-04-16 DIAGNOSIS — E042 Nontoxic multinodular goiter: Secondary | ICD-10-CM | POA: Diagnosis not present

## 2021-04-16 DIAGNOSIS — J309 Allergic rhinitis, unspecified: Secondary | ICD-10-CM | POA: Diagnosis not present

## 2021-04-16 DIAGNOSIS — J302 Other seasonal allergic rhinitis: Secondary | ICD-10-CM | POA: Diagnosis not present

## 2021-04-16 DIAGNOSIS — H9190 Unspecified hearing loss, unspecified ear: Secondary | ICD-10-CM | POA: Diagnosis not present

## 2021-04-16 DIAGNOSIS — K219 Gastro-esophageal reflux disease without esophagitis: Secondary | ICD-10-CM | POA: Diagnosis not present

## 2021-05-14 DIAGNOSIS — Z87898 Personal history of other specified conditions: Secondary | ICD-10-CM | POA: Diagnosis not present

## 2021-05-14 DIAGNOSIS — H9319 Tinnitus, unspecified ear: Secondary | ICD-10-CM | POA: Diagnosis not present

## 2021-05-14 DIAGNOSIS — H903 Sensorineural hearing loss, bilateral: Secondary | ICD-10-CM | POA: Diagnosis not present

## 2021-06-05 DIAGNOSIS — H26493 Other secondary cataract, bilateral: Secondary | ICD-10-CM | POA: Diagnosis not present

## 2021-06-05 DIAGNOSIS — H26491 Other secondary cataract, right eye: Secondary | ICD-10-CM | POA: Diagnosis not present

## 2021-06-15 DIAGNOSIS — H524 Presbyopia: Secondary | ICD-10-CM | POA: Diagnosis not present

## 2021-07-02 DIAGNOSIS — E785 Hyperlipidemia, unspecified: Secondary | ICD-10-CM | POA: Diagnosis not present

## 2021-07-02 DIAGNOSIS — M8589 Other specified disorders of bone density and structure, multiple sites: Secondary | ICD-10-CM | POA: Diagnosis not present

## 2021-07-02 DIAGNOSIS — E042 Nontoxic multinodular goiter: Secondary | ICD-10-CM | POA: Diagnosis not present

## 2021-07-02 DIAGNOSIS — Z79899 Other long term (current) drug therapy: Secondary | ICD-10-CM | POA: Diagnosis not present

## 2021-07-02 DIAGNOSIS — Z8673 Personal history of transient ischemic attack (TIA), and cerebral infarction without residual deficits: Secondary | ICD-10-CM | POA: Diagnosis not present

## 2021-07-02 DIAGNOSIS — I10 Essential (primary) hypertension: Secondary | ICD-10-CM | POA: Diagnosis not present

## 2021-08-30 DIAGNOSIS — S93104A Unspecified dislocation of right toe(s), initial encounter: Secondary | ICD-10-CM | POA: Diagnosis not present

## 2021-08-30 DIAGNOSIS — S91119A Laceration without foreign body of unspecified toe without damage to nail, initial encounter: Secondary | ICD-10-CM | POA: Diagnosis not present

## 2021-08-30 DIAGNOSIS — W010XXA Fall on same level from slipping, tripping and stumbling without subsequent striking against object, initial encounter: Secondary | ICD-10-CM | POA: Diagnosis not present

## 2021-08-30 DIAGNOSIS — S99911A Unspecified injury of right ankle, initial encounter: Secondary | ICD-10-CM | POA: Diagnosis not present

## 2021-08-30 DIAGNOSIS — S91114A Laceration without foreign body of right lesser toe(s) without damage to nail, initial encounter: Secondary | ICD-10-CM | POA: Diagnosis not present

## 2021-08-30 DIAGNOSIS — S93114A Dislocation of interphalangeal joint of right lesser toe(s), initial encounter: Secondary | ICD-10-CM | POA: Diagnosis not present

## 2021-08-30 DIAGNOSIS — M79674 Pain in right toe(s): Secondary | ICD-10-CM | POA: Diagnosis not present

## 2021-10-21 ENCOUNTER — Other Ambulatory Visit: Payer: Self-pay | Admitting: Podiatry

## 2021-10-21 ENCOUNTER — Encounter: Payer: Self-pay | Admitting: Podiatry

## 2021-10-21 ENCOUNTER — Ambulatory Visit (INDEPENDENT_AMBULATORY_CARE_PROVIDER_SITE_OTHER): Payer: Medicare HMO

## 2021-10-21 ENCOUNTER — Ambulatory Visit: Payer: Medicare HMO | Admitting: Podiatry

## 2021-10-21 DIAGNOSIS — M7751 Other enthesopathy of right foot: Secondary | ICD-10-CM | POA: Diagnosis not present

## 2021-10-21 DIAGNOSIS — M722 Plantar fascial fibromatosis: Secondary | ICD-10-CM | POA: Diagnosis not present

## 2021-10-21 DIAGNOSIS — M79671 Pain in right foot: Secondary | ICD-10-CM

## 2021-10-21 NOTE — Progress Notes (Signed)
Subjective:  Patient ID: Katrina Rocha, female    DOB: 01-Feb-1945,  MRN: [id number]  Chief Complaint  Patient presents with   Foot Pain    Pt stated that she has some foot pain went to emergency room had xrays was told it was not broken but is still having some discomfort.     76 y.o. female presents with the above complaint.  Patient presents with complaint left Planter fasciitis pain.  Patient states heel pain been going on for quite some time she went to the emergency room and was told that it is not broken.  She is still having discomfort and hurts with ambulation hurts with pressure.  She also has pain on the right foot at the third metatarsophalangeal joint.  She has not seen MRIs prior to seeing me pain scale 7 out of 10 sharp shooting in nature.   Review of Systems: Negative except as noted in the HPI. Denies N/V/F/Ch.  Past Medical History:  Diagnosis Date   Arthritis    Fibrocystic breast disease    GERD (gastroesophageal reflux disease)    Glaucoma    Heart attack (HCC)    Hypertension    Irritable bowel syndrome    Lactose intolerance    Rectal incontinence    Sinus complaint    Thyroid disease    Transient cerebral ischemia     Current Outpatient Medications:    aspirin 325 MG tablet, Take 325 mg by mouth daily., Disp: , Rfl:    CALCIUM PO, Take 2 tablets by mouth daily in the afternoon. 2 gummies, Disp: , Rfl:    EQ PAIN RELIEVER 500 MG tablet, TAKE 2 TABLETS BY MOUTH THREE TIMES DAILY AS NEEDED FOR PAIN, Disp: , Rfl:    fexofenadine (ALLEGRA) 180 MG tablet, Take 180 mg by mouth daily., Disp: , Rfl:    ibuprofen (ADVIL) 600 MG tablet, Take 600 mg by mouth every 6 (six) hours as needed., Disp: , Rfl:    latanoprost (XALATAN) 0.005 % ophthalmic solution, INSTILL 1 DROP INTO AFFECTED EYE(S) ONCE DAILY IN THE EVENING (Patient not taking: Reported on 02/10/2021), Disp: , Rfl: 11   lisinopril (PRINIVIL,ZESTRIL) 20 MG tablet, Take 20 mg by mouth 2 (two) times daily.,  Disp: , Rfl:    MULTIPLE VITAMIN PO, Take 2 tablets by mouth daily in the afternoon. 2 gummies a day, Disp: , Rfl:    omeprazole (PRILOSEC) 40 MG capsule, Take 40 mg by mouth daily., Disp: , Rfl:    oxyCODONE (OXY IR/ROXICODONE) 5 MG immediate release tablet, Take 2.5 mg by mouth every 8 (eight) hours as needed., Disp: , Rfl:    rosuvastatin (CRESTOR) 10 MG tablet, Take 10 mg by mouth daily. Not sure of the mg/lc, Disp: , Rfl:    SHINGRIX injection, , Disp: , Rfl:   Social History   Tobacco Use  Smoking Status Never  Smokeless Tobacco Never    Allergies  Allergen Reactions   Latex Rash   Tape Rash   Objective:  There were no vitals filed for this visit. There is no height or weight on file to calculate BMI. Constitutional Well developed. Well nourished.  Vascular Dorsalis pedis pulses palpable bilaterally. Posterior tibial pulses palpable bilaterally. Capillary refill normal to all digits.  No cyanosis or clubbing noted. Pedal hair growth normal.  Neurologic Normal speech. Oriented to person, place, and time. Epicritic sensation to light touch grossly present bilaterally.  Dermatologic Nails well groomed and normal in appearance. No open wounds.  No skin lesions.  Orthopedic: Normal joint ROM without pain or crepitus bilaterally. No visible deformities. Tender to palpation at the calcaneal tuber left. No pain with calcaneal squeeze left. Ankle ROM diminished range of motion left. Silfverskiold Test: positive left.   Radiographs: 3 views of skeletally mature adult right foot: Arthritis mildly noted with hallux limitus changes to the first metatarsophalangeal joint.Bipartite sesamoid noted hammertoe contractures noted.  Pes cavus foot type noted subtalar joint arthritis noted.  Assessment:   1. Plantar fasciitis of left foot   2. Capsulitis of metatarsophalangeal (MTP) joint of right foot    Plan:  Patient was evaluated and treated and all questions answered.  Right  third metatarsophalangeal joint capsulitis -I explained to the patient the etiology of capsulitis and worse treatment options were discussed.  Given the amount of pain that she is having I believe she will benefit from injection.Patient agrees with plan like to proceed with injection -A steroid injection was performed at right third MTP using 1% plain Lidocaine and 10 mg of Kenalog. This was well tolerated.   Plantar Fasciitis, left - XR reviewed as above.  - Educated on icing and stretching. Instructions given.  - Injection delivered to the plantar fascia as below. - DME: Plantar fascial brace dispensed to support the medial longitudinal arch of the foot and offload pressure from the heel and prevent arch collapse during weightbearing - Pharmacologic management: None  Procedure: Injection Tendon/Ligament Location: Left plantar fascia at the glabrous junction; medial approach. Skin Prep: alcohol Injectate: 0.5 cc 0.5% marcaine plain, 0.5 cc of 1% Lidocaine, 0.5 cc kenalog 10. Disposition: Patient tolerated procedure well. Injection site dressed with a band-aid.  No follow-ups on file.

## 2021-11-18 ENCOUNTER — Ambulatory Visit: Payer: Medicare HMO | Admitting: Podiatry

## 2021-11-18 DIAGNOSIS — M722 Plantar fascial fibromatosis: Secondary | ICD-10-CM | POA: Diagnosis not present

## 2021-11-18 NOTE — Progress Notes (Signed)
Subjective:  Patient ID: Katrina Rocha, female    DOB: 1945/06/02,  MRN: [id number]  Chief Complaint  Patient presents with   Plantar Fasciitis    Pt stated that her pain is a little better     76 y.o. female presents with the above complaint.  Patient was asked to follow-up with left Planter fasciitis.  Patient states that she is doing well she still has a little residual pain in the left heel but the right side is doing great.  She would like to discuss next treatment plan.  Denies any other acute pain scale is 4 out of 10 dull achy in nature.   Review of Systems: Negative except as noted in the HPI. Denies N/V/F/Ch.  Past Medical History:  Diagnosis Date   Arthritis    Fibrocystic breast disease    GERD (gastroesophageal reflux disease)    Glaucoma    Heart attack (HCC)    Hypertension    Irritable bowel syndrome    Lactose intolerance    Rectal incontinence    Sinus complaint    Thyroid disease    Transient cerebral ischemia     Current Outpatient Medications:    aspirin 325 MG tablet, Take 325 mg by mouth daily., Disp: , Rfl:    CALCIUM PO, Take 2 tablets by mouth daily in the afternoon. 2 gummies, Disp: , Rfl:    EQ PAIN RELIEVER 500 MG tablet, TAKE 2 TABLETS BY MOUTH THREE TIMES DAILY AS NEEDED FOR PAIN, Disp: , Rfl:    fexofenadine (ALLEGRA) 180 MG tablet, Take 180 mg by mouth daily., Disp: , Rfl:    ibuprofen (ADVIL) 600 MG tablet, Take 600 mg by mouth every 6 (six) hours as needed., Disp: , Rfl:    latanoprost (XALATAN) 0.005 % ophthalmic solution, INSTILL 1 DROP INTO AFFECTED EYE(S) ONCE DAILY IN THE EVENING (Patient not taking: Reported on 02/10/2021), Disp: , Rfl: 11   lisinopril (PRINIVIL,ZESTRIL) 20 MG tablet, Take 20 mg by mouth 2 (two) times daily., Disp: , Rfl:    MULTIPLE VITAMIN PO, Take 2 tablets by mouth daily in the afternoon. 2 gummies a day, Disp: , Rfl:    omeprazole (PRILOSEC) 40 MG capsule, Take 40 mg by mouth daily., Disp: , Rfl:    oxyCODONE (OXY  IR/ROXICODONE) 5 MG immediate release tablet, Take 2.5 mg by mouth every 8 (eight) hours as needed., Disp: , Rfl:    rosuvastatin (CRESTOR) 10 MG tablet, Take 10 mg by mouth daily. Not sure of the mg/lc, Disp: , Rfl:    SHINGRIX injection, , Disp: , Rfl:   Social History   Tobacco Use  Smoking Status Never  Smokeless Tobacco Never    Allergies  Allergen Reactions   Latex Rash   Tape Rash   Objective:  There were no vitals filed for this visit. There is no height or weight on file to calculate BMI. Constitutional Well developed. Well nourished.  Vascular Dorsalis pedis pulses palpable bilaterally. Posterior tibial pulses palpable bilaterally. Capillary refill normal to all digits.  No cyanosis or clubbing noted. Pedal hair growth normal.  Neurologic Normal speech. Oriented to person, place, and time. Epicritic sensation to light touch grossly present bilaterally.  Dermatologic Nails well groomed and normal in appearance. No open wounds. No skin lesions.  Orthopedic: Normal joint ROM without pain or crepitus bilaterally. No visible deformities. Tender to palpation at the calcaneal tuber left. No pain with calcaneal squeeze left. Ankle ROM diminished range of motion left. Silfverskiold  Test: positive left.   Radiographs: 3 views of skeletally mature adult right foot: Arthritis mildly noted with hallux limitus changes to the first metatarsophalangeal joint.Bipartite sesamoid noted hammertoe contractures noted.  Pes cavus foot type noted subtalar joint arthritis noted.  Assessment:   No diagnosis found.  Plan:  Patient was evaluated and treated and all questions answered.  Right third metatarsophalangeal joint capsulitis -Clinically healed no further pain noted.  At this time I discussed shoe gear modification in extensive detail she states understanding I discussed orthotics as well.   Plantar Fasciitis, left - XR reviewed as above.  - Educated on icing and  stretching. Instructions given.  -Second injection delivered to the plantar fascia as below. - DME: Continue plantar fascial brace night splint was dispensed - Pharmacologic management: None  Procedure: Injection Tendon/Ligament Location: Left plantar fascia at the glabrous junction; medial approach. Skin Prep: alcohol Injectate: 0.5 cc 0.5% marcaine plain, 0.5 cc of 1% Lidocaine, 0.5 cc kenalog 10. Disposition: Patient tolerated procedure well. Injection site dressed with a band-aid.  No follow-ups on file.

## 2021-11-26 DIAGNOSIS — H5213 Myopia, bilateral: Secondary | ICD-10-CM | POA: Diagnosis not present

## 2021-11-26 DIAGNOSIS — H52209 Unspecified astigmatism, unspecified eye: Secondary | ICD-10-CM | POA: Diagnosis not present

## 2021-11-26 DIAGNOSIS — H524 Presbyopia: Secondary | ICD-10-CM | POA: Diagnosis not present

## 2022-01-13 DIAGNOSIS — Z6828 Body mass index (BMI) 28.0-28.9, adult: Secondary | ICD-10-CM | POA: Diagnosis not present

## 2022-01-13 DIAGNOSIS — E042 Nontoxic multinodular goiter: Secondary | ICD-10-CM | POA: Diagnosis not present

## 2022-01-13 DIAGNOSIS — Z1231 Encounter for screening mammogram for malignant neoplasm of breast: Secondary | ICD-10-CM | POA: Diagnosis not present

## 2022-01-13 DIAGNOSIS — Z79899 Other long term (current) drug therapy: Secondary | ICD-10-CM | POA: Diagnosis not present

## 2022-01-13 DIAGNOSIS — I10 Essential (primary) hypertension: Secondary | ICD-10-CM | POA: Diagnosis not present

## 2022-01-13 DIAGNOSIS — Z8673 Personal history of transient ischemic attack (TIA), and cerebral infarction without residual deficits: Secondary | ICD-10-CM | POA: Diagnosis not present

## 2022-01-13 DIAGNOSIS — E785 Hyperlipidemia, unspecified: Secondary | ICD-10-CM | POA: Diagnosis not present

## 2022-02-26 DIAGNOSIS — Z1231 Encounter for screening mammogram for malignant neoplasm of breast: Secondary | ICD-10-CM | POA: Diagnosis not present

## 2022-05-05 DIAGNOSIS — N181 Chronic kidney disease, stage 1: Secondary | ICD-10-CM | POA: Diagnosis not present

## 2022-05-05 DIAGNOSIS — Z8673 Personal history of transient ischemic attack (TIA), and cerebral infarction without residual deficits: Secondary | ICD-10-CM | POA: Diagnosis not present

## 2022-05-05 DIAGNOSIS — M545 Low back pain, unspecified: Secondary | ICD-10-CM | POA: Diagnosis not present

## 2022-05-05 DIAGNOSIS — M8580 Other specified disorders of bone density and structure, unspecified site: Secondary | ICD-10-CM | POA: Diagnosis not present

## 2022-05-05 DIAGNOSIS — K219 Gastro-esophageal reflux disease without esophagitis: Secondary | ICD-10-CM | POA: Diagnosis not present

## 2022-05-05 DIAGNOSIS — M1990 Unspecified osteoarthritis, unspecified site: Secondary | ICD-10-CM | POA: Diagnosis not present

## 2022-05-05 DIAGNOSIS — E785 Hyperlipidemia, unspecified: Secondary | ICD-10-CM | POA: Diagnosis not present

## 2022-05-05 DIAGNOSIS — Z8249 Family history of ischemic heart disease and other diseases of the circulatory system: Secondary | ICD-10-CM | POA: Diagnosis not present

## 2022-05-05 DIAGNOSIS — H409 Unspecified glaucoma: Secondary | ICD-10-CM | POA: Diagnosis not present

## 2022-05-05 DIAGNOSIS — E890 Postprocedural hypothyroidism: Secondary | ICD-10-CM | POA: Diagnosis not present

## 2022-05-05 DIAGNOSIS — I129 Hypertensive chronic kidney disease with stage 1 through stage 4 chronic kidney disease, or unspecified chronic kidney disease: Secondary | ICD-10-CM | POA: Diagnosis not present

## 2022-05-05 DIAGNOSIS — I700 Atherosclerosis of aorta: Secondary | ICD-10-CM | POA: Diagnosis not present

## 2022-06-22 DIAGNOSIS — M25511 Pain in right shoulder: Secondary | ICD-10-CM | POA: Diagnosis not present

## 2022-07-05 DIAGNOSIS — H26492 Other secondary cataract, left eye: Secondary | ICD-10-CM | POA: Diagnosis not present

## 2022-07-05 DIAGNOSIS — H401131 Primary open-angle glaucoma, bilateral, mild stage: Secondary | ICD-10-CM | POA: Diagnosis not present

## 2022-07-14 DIAGNOSIS — E785 Hyperlipidemia, unspecified: Secondary | ICD-10-CM | POA: Diagnosis not present

## 2022-07-14 DIAGNOSIS — E042 Nontoxic multinodular goiter: Secondary | ICD-10-CM | POA: Diagnosis not present

## 2022-07-14 DIAGNOSIS — I10 Essential (primary) hypertension: Secondary | ICD-10-CM | POA: Diagnosis not present

## 2022-07-14 DIAGNOSIS — M8589 Other specified disorders of bone density and structure, multiple sites: Secondary | ICD-10-CM | POA: Diagnosis not present

## 2022-07-14 DIAGNOSIS — Z8673 Personal history of transient ischemic attack (TIA), and cerebral infarction without residual deficits: Secondary | ICD-10-CM | POA: Diagnosis not present

## 2022-08-11 DIAGNOSIS — Z9181 History of falling: Secondary | ICD-10-CM | POA: Diagnosis not present

## 2022-08-11 DIAGNOSIS — Z0000 Encounter for general adult medical examination without abnormal findings: Secondary | ICD-10-CM | POA: Diagnosis not present

## 2022-09-28 DIAGNOSIS — M25511 Pain in right shoulder: Secondary | ICD-10-CM | POA: Diagnosis not present

## 2022-09-28 DIAGNOSIS — M7551 Bursitis of right shoulder: Secondary | ICD-10-CM | POA: Diagnosis not present

## 2022-09-30 DIAGNOSIS — R350 Frequency of micturition: Secondary | ICD-10-CM | POA: Diagnosis not present

## 2022-10-04 DIAGNOSIS — M25511 Pain in right shoulder: Secondary | ICD-10-CM | POA: Diagnosis not present

## 2022-10-04 DIAGNOSIS — M19011 Primary osteoarthritis, right shoulder: Secondary | ICD-10-CM | POA: Diagnosis not present

## 2022-10-04 DIAGNOSIS — M7581 Other shoulder lesions, right shoulder: Secondary | ICD-10-CM | POA: Diagnosis not present

## 2022-10-04 DIAGNOSIS — M778 Other enthesopathies, not elsewhere classified: Secondary | ICD-10-CM | POA: Diagnosis not present

## 2022-10-04 DIAGNOSIS — M948X1 Other specified disorders of cartilage, shoulder: Secondary | ICD-10-CM | POA: Diagnosis not present

## 2022-10-12 DIAGNOSIS — M25511 Pain in right shoulder: Secondary | ICD-10-CM | POA: Diagnosis not present

## 2022-10-12 DIAGNOSIS — M7551 Bursitis of right shoulder: Secondary | ICD-10-CM | POA: Diagnosis not present

## 2022-10-12 DIAGNOSIS — M75101 Unspecified rotator cuff tear or rupture of right shoulder, not specified as traumatic: Secondary | ICD-10-CM | POA: Diagnosis not present

## 2022-11-18 DIAGNOSIS — M25511 Pain in right shoulder: Secondary | ICD-10-CM | POA: Diagnosis not present

## 2022-11-18 DIAGNOSIS — Z23 Encounter for immunization: Secondary | ICD-10-CM | POA: Diagnosis not present

## 2022-11-18 DIAGNOSIS — I10 Essential (primary) hypertension: Secondary | ICD-10-CM | POA: Diagnosis not present

## 2022-11-18 DIAGNOSIS — M8589 Other specified disorders of bone density and structure, multiple sites: Secondary | ICD-10-CM | POA: Diagnosis not present

## 2022-11-18 DIAGNOSIS — E042 Nontoxic multinodular goiter: Secondary | ICD-10-CM | POA: Diagnosis not present

## 2022-11-18 DIAGNOSIS — E785 Hyperlipidemia, unspecified: Secondary | ICD-10-CM | POA: Diagnosis not present

## 2022-11-18 DIAGNOSIS — Z8673 Personal history of transient ischemic attack (TIA), and cerebral infarction without residual deficits: Secondary | ICD-10-CM | POA: Diagnosis not present

## 2022-11-18 DIAGNOSIS — M75101 Unspecified rotator cuff tear or rupture of right shoulder, not specified as traumatic: Secondary | ICD-10-CM | POA: Diagnosis not present

## 2022-12-06 DIAGNOSIS — M75101 Unspecified rotator cuff tear or rupture of right shoulder, not specified as traumatic: Secondary | ICD-10-CM | POA: Diagnosis not present

## 2022-12-06 DIAGNOSIS — M7551 Bursitis of right shoulder: Secondary | ICD-10-CM | POA: Diagnosis not present

## 2022-12-06 DIAGNOSIS — M25511 Pain in right shoulder: Secondary | ICD-10-CM | POA: Diagnosis not present

## 2022-12-16 DIAGNOSIS — M75121 Complete rotator cuff tear or rupture of right shoulder, not specified as traumatic: Secondary | ICD-10-CM | POA: Diagnosis not present

## 2022-12-16 DIAGNOSIS — M19011 Primary osteoarthritis, right shoulder: Secondary | ICD-10-CM | POA: Diagnosis not present

## 2022-12-16 DIAGNOSIS — M7551 Bursitis of right shoulder: Secondary | ICD-10-CM | POA: Diagnosis not present

## 2022-12-16 DIAGNOSIS — M12811 Other specific arthropathies, not elsewhere classified, right shoulder: Secondary | ICD-10-CM | POA: Diagnosis not present

## 2022-12-16 DIAGNOSIS — M25511 Pain in right shoulder: Secondary | ICD-10-CM | POA: Diagnosis not present

## 2022-12-16 DIAGNOSIS — Z79899 Other long term (current) drug therapy: Secondary | ICD-10-CM | POA: Diagnosis not present

## 2022-12-16 DIAGNOSIS — M51369 Other intervertebral disc degeneration, lumbar region without mention of lumbar back pain or lower extremity pain: Secondary | ICD-10-CM | POA: Diagnosis not present

## 2022-12-16 DIAGNOSIS — M75101 Unspecified rotator cuff tear or rupture of right shoulder, not specified as traumatic: Secondary | ICD-10-CM | POA: Diagnosis not present

## 2022-12-16 DIAGNOSIS — E785 Hyperlipidemia, unspecified: Secondary | ICD-10-CM | POA: Diagnosis not present

## 2022-12-16 DIAGNOSIS — H9190 Unspecified hearing loss, unspecified ear: Secondary | ICD-10-CM | POA: Diagnosis not present

## 2022-12-16 DIAGNOSIS — M7541 Impingement syndrome of right shoulder: Secondary | ICD-10-CM | POA: Diagnosis not present

## 2022-12-16 DIAGNOSIS — I10 Essential (primary) hypertension: Secondary | ICD-10-CM | POA: Diagnosis not present

## 2022-12-16 DIAGNOSIS — H409 Unspecified glaucoma: Secondary | ICD-10-CM | POA: Diagnosis not present

## 2022-12-16 DIAGNOSIS — R0683 Snoring: Secondary | ICD-10-CM | POA: Diagnosis not present

## 2022-12-16 DIAGNOSIS — E0590 Thyrotoxicosis, unspecified without thyrotoxic crisis or storm: Secondary | ICD-10-CM | POA: Diagnosis not present

## 2022-12-16 DIAGNOSIS — K219 Gastro-esophageal reflux disease without esophagitis: Secondary | ICD-10-CM | POA: Diagnosis not present

## 2022-12-16 DIAGNOSIS — G8918 Other acute postprocedural pain: Secondary | ICD-10-CM | POA: Diagnosis not present

## 2022-12-20 DIAGNOSIS — M25611 Stiffness of right shoulder, not elsewhere classified: Secondary | ICD-10-CM | POA: Diagnosis not present

## 2022-12-20 DIAGNOSIS — M25511 Pain in right shoulder: Secondary | ICD-10-CM | POA: Diagnosis not present

## 2022-12-22 DIAGNOSIS — M25511 Pain in right shoulder: Secondary | ICD-10-CM | POA: Diagnosis not present

## 2022-12-22 DIAGNOSIS — M25611 Stiffness of right shoulder, not elsewhere classified: Secondary | ICD-10-CM | POA: Diagnosis not present

## 2022-12-28 DIAGNOSIS — M25511 Pain in right shoulder: Secondary | ICD-10-CM | POA: Diagnosis not present

## 2022-12-28 DIAGNOSIS — M25611 Stiffness of right shoulder, not elsewhere classified: Secondary | ICD-10-CM | POA: Diagnosis not present

## 2022-12-30 DIAGNOSIS — M25511 Pain in right shoulder: Secondary | ICD-10-CM | POA: Diagnosis not present

## 2022-12-30 DIAGNOSIS — M25611 Stiffness of right shoulder, not elsewhere classified: Secondary | ICD-10-CM | POA: Diagnosis not present

## 2023-01-03 DIAGNOSIS — M25611 Stiffness of right shoulder, not elsewhere classified: Secondary | ICD-10-CM | POA: Diagnosis not present

## 2023-01-03 DIAGNOSIS — M25511 Pain in right shoulder: Secondary | ICD-10-CM | POA: Diagnosis not present

## 2023-01-06 DIAGNOSIS — M25511 Pain in right shoulder: Secondary | ICD-10-CM | POA: Diagnosis not present

## 2023-01-06 DIAGNOSIS — M25611 Stiffness of right shoulder, not elsewhere classified: Secondary | ICD-10-CM | POA: Diagnosis not present

## 2023-01-11 DIAGNOSIS — M25511 Pain in right shoulder: Secondary | ICD-10-CM | POA: Diagnosis not present

## 2023-01-11 DIAGNOSIS — M25611 Stiffness of right shoulder, not elsewhere classified: Secondary | ICD-10-CM | POA: Diagnosis not present

## 2023-01-14 DIAGNOSIS — M25611 Stiffness of right shoulder, not elsewhere classified: Secondary | ICD-10-CM | POA: Diagnosis not present

## 2023-01-14 DIAGNOSIS — M25511 Pain in right shoulder: Secondary | ICD-10-CM | POA: Diagnosis not present

## 2023-01-20 DIAGNOSIS — Z8673 Personal history of transient ischemic attack (TIA), and cerebral infarction without residual deficits: Secondary | ICD-10-CM | POA: Diagnosis not present

## 2023-01-20 DIAGNOSIS — I10 Essential (primary) hypertension: Secondary | ICD-10-CM | POA: Diagnosis not present

## 2023-01-20 DIAGNOSIS — E785 Hyperlipidemia, unspecified: Secondary | ICD-10-CM | POA: Diagnosis not present

## 2023-01-20 DIAGNOSIS — E042 Nontoxic multinodular goiter: Secondary | ICD-10-CM | POA: Diagnosis not present

## 2023-01-21 DIAGNOSIS — M25511 Pain in right shoulder: Secondary | ICD-10-CM | POA: Diagnosis not present

## 2023-01-21 DIAGNOSIS — M25611 Stiffness of right shoulder, not elsewhere classified: Secondary | ICD-10-CM | POA: Diagnosis not present

## 2023-01-26 DIAGNOSIS — M25611 Stiffness of right shoulder, not elsewhere classified: Secondary | ICD-10-CM | POA: Diagnosis not present

## 2023-01-26 DIAGNOSIS — M25511 Pain in right shoulder: Secondary | ICD-10-CM | POA: Diagnosis not present

## 2023-01-28 DIAGNOSIS — M25511 Pain in right shoulder: Secondary | ICD-10-CM | POA: Diagnosis not present

## 2023-01-28 DIAGNOSIS — M25611 Stiffness of right shoulder, not elsewhere classified: Secondary | ICD-10-CM | POA: Diagnosis not present

## 2023-02-01 DIAGNOSIS — M25511 Pain in right shoulder: Secondary | ICD-10-CM | POA: Diagnosis not present

## 2023-02-01 DIAGNOSIS — M25611 Stiffness of right shoulder, not elsewhere classified: Secondary | ICD-10-CM | POA: Diagnosis not present

## 2023-02-03 DIAGNOSIS — M25511 Pain in right shoulder: Secondary | ICD-10-CM | POA: Diagnosis not present

## 2023-02-03 DIAGNOSIS — M25611 Stiffness of right shoulder, not elsewhere classified: Secondary | ICD-10-CM | POA: Diagnosis not present

## 2023-02-07 DIAGNOSIS — M25611 Stiffness of right shoulder, not elsewhere classified: Secondary | ICD-10-CM | POA: Diagnosis not present

## 2023-02-07 DIAGNOSIS — M25511 Pain in right shoulder: Secondary | ICD-10-CM | POA: Diagnosis not present

## 2023-02-09 DIAGNOSIS — M25611 Stiffness of right shoulder, not elsewhere classified: Secondary | ICD-10-CM | POA: Diagnosis not present

## 2023-02-09 DIAGNOSIS — M25511 Pain in right shoulder: Secondary | ICD-10-CM | POA: Diagnosis not present

## 2023-02-14 DIAGNOSIS — M25511 Pain in right shoulder: Secondary | ICD-10-CM | POA: Diagnosis not present

## 2023-02-14 DIAGNOSIS — M25611 Stiffness of right shoulder, not elsewhere classified: Secondary | ICD-10-CM | POA: Diagnosis not present

## 2023-02-16 DIAGNOSIS — M25611 Stiffness of right shoulder, not elsewhere classified: Secondary | ICD-10-CM | POA: Diagnosis not present

## 2023-02-16 DIAGNOSIS — M25511 Pain in right shoulder: Secondary | ICD-10-CM | POA: Diagnosis not present

## 2023-02-23 DIAGNOSIS — M25611 Stiffness of right shoulder, not elsewhere classified: Secondary | ICD-10-CM | POA: Diagnosis not present

## 2023-02-23 DIAGNOSIS — M25511 Pain in right shoulder: Secondary | ICD-10-CM | POA: Diagnosis not present

## 2023-02-25 DIAGNOSIS — M25611 Stiffness of right shoulder, not elsewhere classified: Secondary | ICD-10-CM | POA: Diagnosis not present

## 2023-02-25 DIAGNOSIS — M25511 Pain in right shoulder: Secondary | ICD-10-CM | POA: Diagnosis not present

## 2023-03-04 DIAGNOSIS — M25511 Pain in right shoulder: Secondary | ICD-10-CM | POA: Diagnosis not present

## 2023-03-04 DIAGNOSIS — M25611 Stiffness of right shoulder, not elsewhere classified: Secondary | ICD-10-CM | POA: Diagnosis not present

## 2023-03-10 DIAGNOSIS — M25611 Stiffness of right shoulder, not elsewhere classified: Secondary | ICD-10-CM | POA: Diagnosis not present

## 2023-03-10 DIAGNOSIS — M25511 Pain in right shoulder: Secondary | ICD-10-CM | POA: Diagnosis not present

## 2023-03-10 IMAGING — CT CT ABD-PELV W/ CM
2 of 5 series · 16 of 46 positions shown, 18 images · IV contrast (agent unspecified)
Comparison: 10/02/2015 from [facility]

CLINICAL DATA: Lower abdominal pain for 6 months. Change in bowel
habits.

EXAM:
CT ABDOMEN AND PELVIS WITH CONTRAST
TECHNIQUE: Multidetector CT imaging of the abdomen and pelvis was performed
using the standard protocol following bolus administration of
intravenous contrast.

[Series 2: axial st · axial · 0.85mm/px · z∈[-411,-26]mm · 13 of 87 slices shown, 15 images]
[im 5/87  soft-tissue]
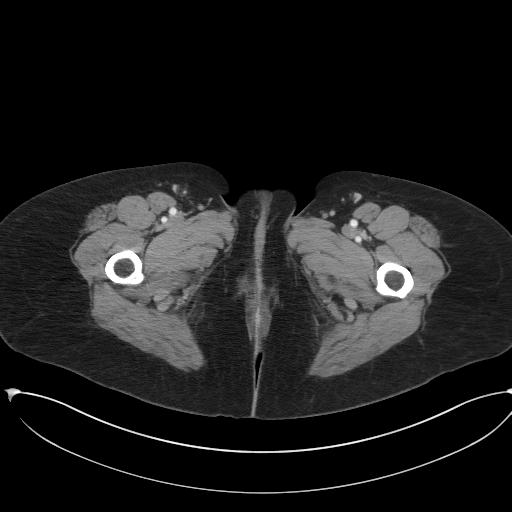
[im 5/87  bone]
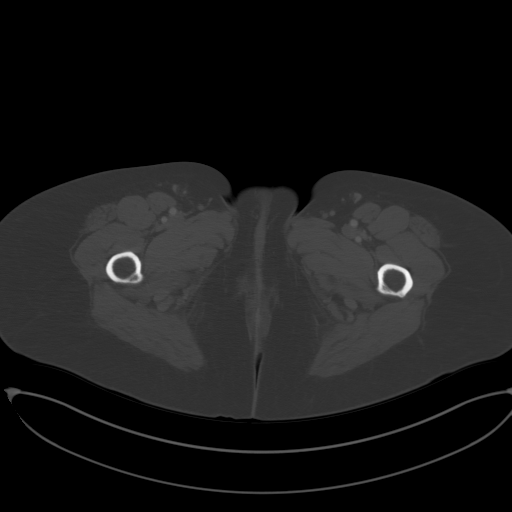
[im 13/87  soft-tissue]
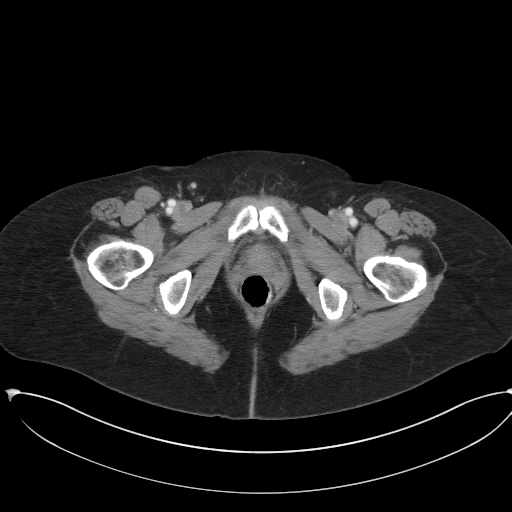
[im 18/87  soft-tissue]
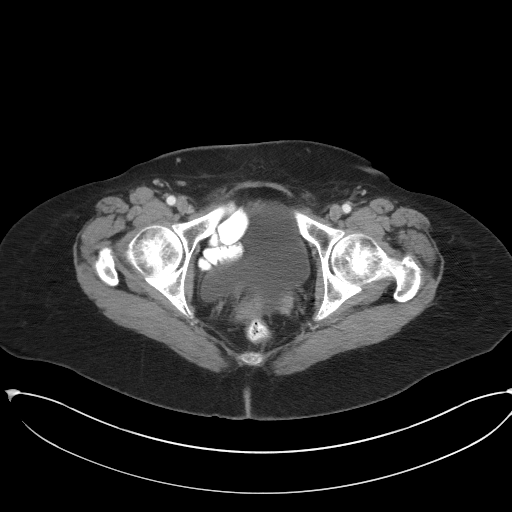
[im 26/87  soft-tissue]
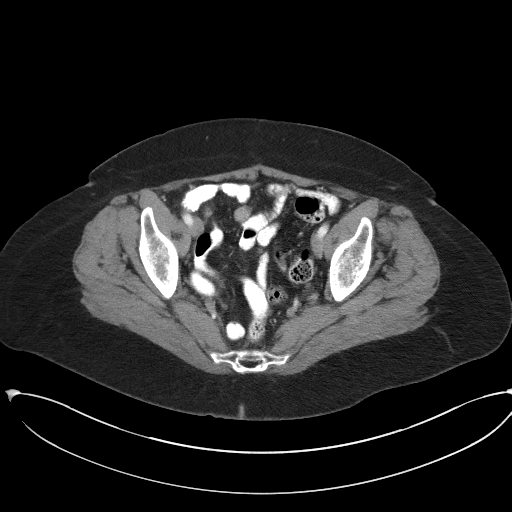
[im 31/87  soft-tissue]
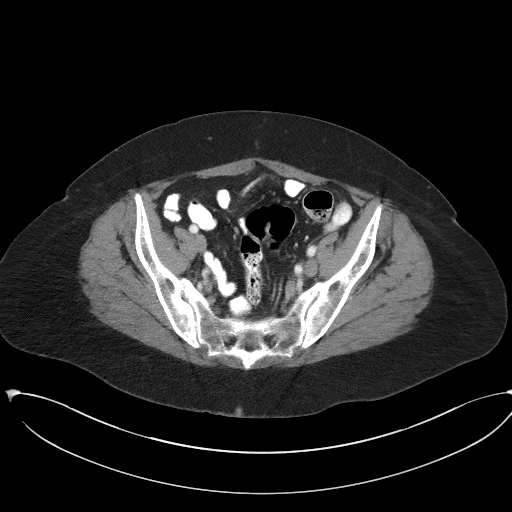
[im 39/87  soft-tissue]
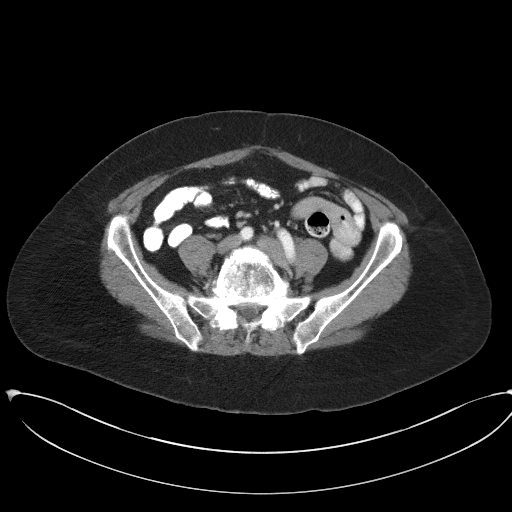
[im 44/87  soft-tissue]
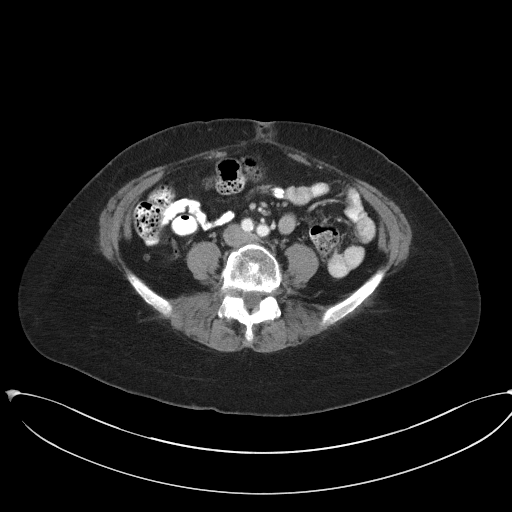
[im 48/87  soft-tissue]
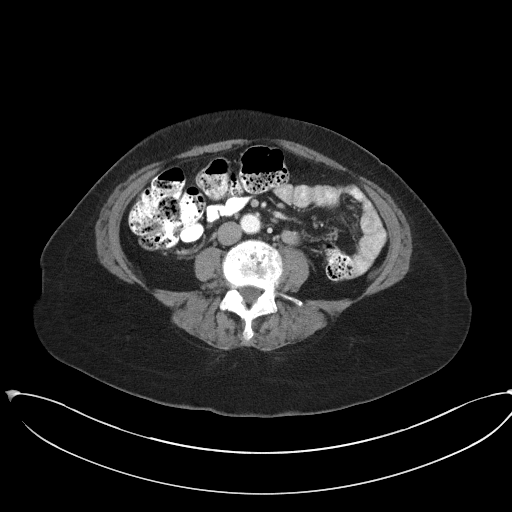
[im 56/87  soft-tissue]
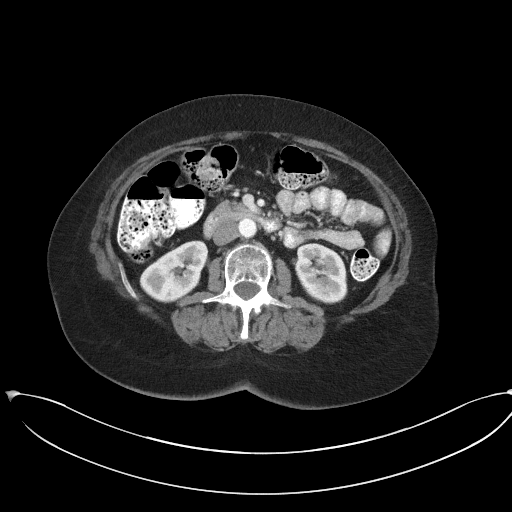
[im 56/87  bone]
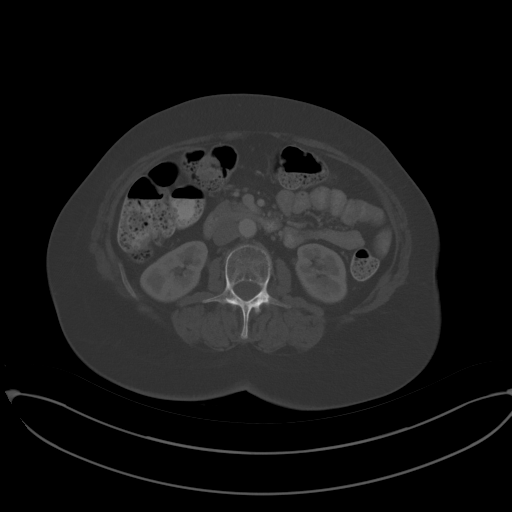
[im 61/87  soft-tissue]
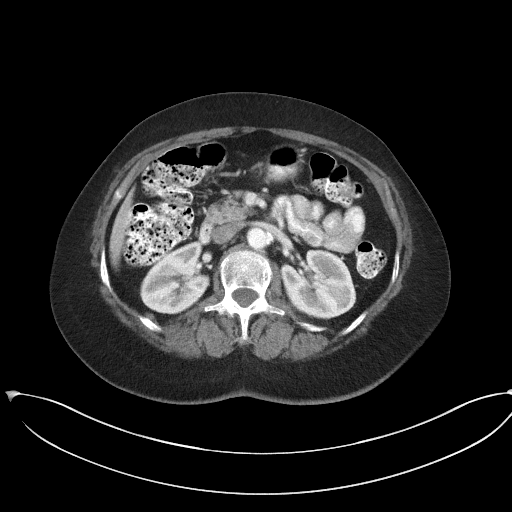
[im 69/87  soft-tissue]
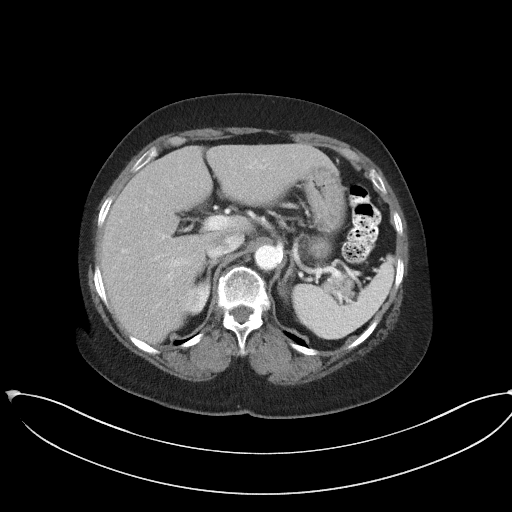
[im 74/87  soft-tissue]
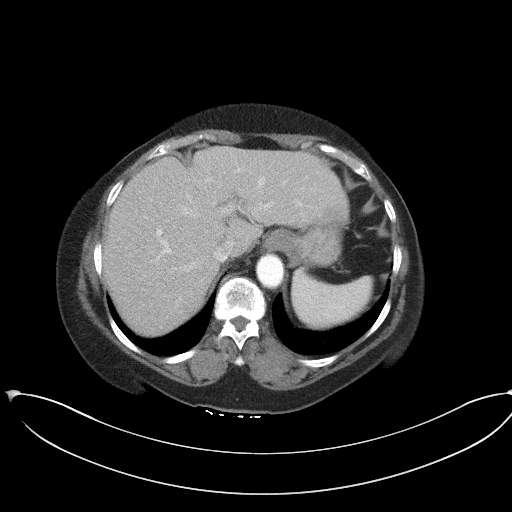
[im 82/87  soft-tissue]
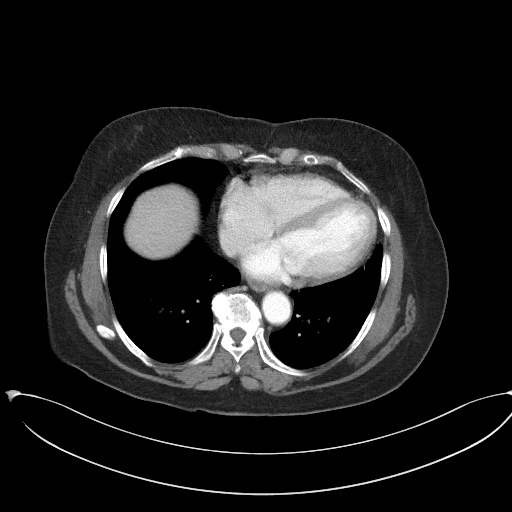

[Series 5: coronal st · coronal · 0.76mm/px · 3 of 91 slices shown]
[im 31/91  soft-tissue]
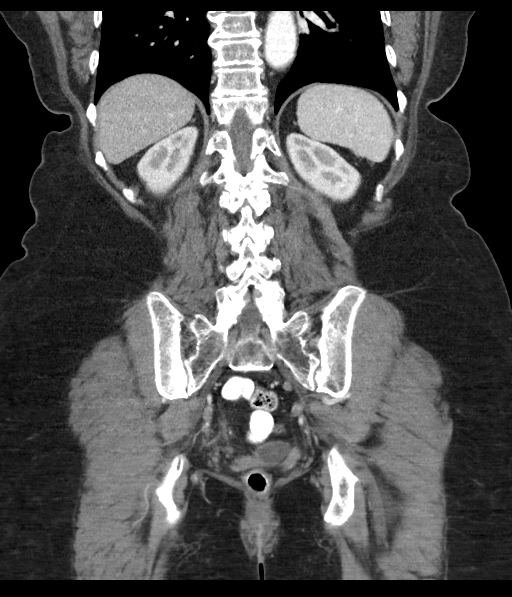
[im 41/91  soft-tissue]
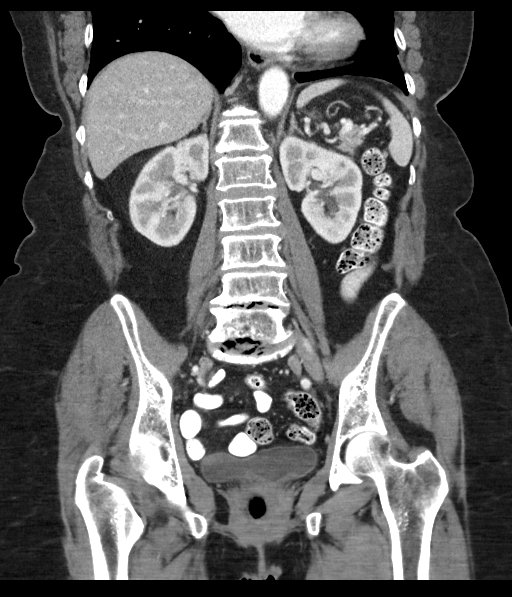
[im 51/91  soft-tissue]
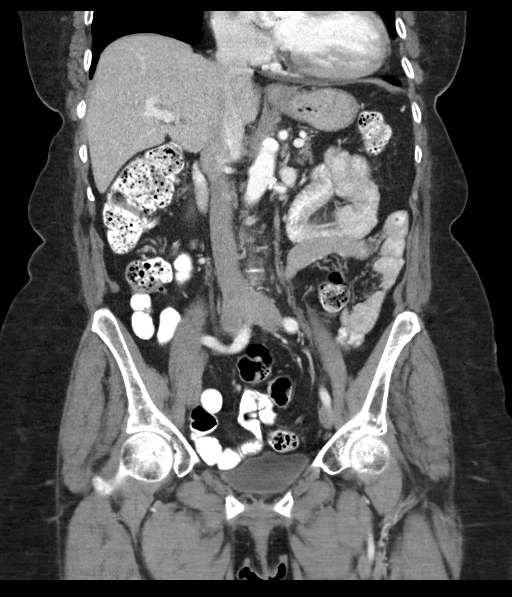

[16 of 46 positions shown; findings below may reference images not displayed]

RADIATION DOSE REDUCTION: This exam was performed according to the
departmental dose-optimization program which includes automated
exposure control, adjustment of the mA and/or kV according to
patient size and/or use of iterative reconstruction technique.

CONTRAST:  100mL OMNIPAQUE IOHEXOL 300 MG/ML  SOLN
FINDINGS: Lower Chest: No acute findings.

Hepatobiliary: Several tiny benign hepatic cysts remain stable. No
hepatic masses identified. Gallbladder is unremarkable. No evidence
of biliary ductal dilatation.

Pancreas:  No mass or inflammatory changes.

Spleen: Within normal limits in size and appearance.

Adrenals/Urinary Tract: No masses identified. No evidence of
ureteral calculi or hydronephrosis. Unremarkable unopacified urinary
bladder.

Stomach/Bowel: No evidence of obstruction, inflammatory process or
abnormal fluid collections. Normal appendix visualized.

Vascular/Lymphatic: No pathologically enlarged lymph nodes. No acute
vascular findings. Aortic atherosclerotic calcification noted.

Reproductive: Prior hysterectomy noted. Adnexal regions are
unremarkable in appearance.

Other:  None.

Musculoskeletal:  No suspicious bone lesions identified.
IMPRESSION: Stable exam.  No acute findings or other significant abnormality.

Aortic Atherosclerosis ([id number].H).

## 2023-03-16 DIAGNOSIS — M25511 Pain in right shoulder: Secondary | ICD-10-CM | POA: Diagnosis not present

## 2023-03-16 DIAGNOSIS — M25611 Stiffness of right shoulder, not elsewhere classified: Secondary | ICD-10-CM | POA: Diagnosis not present

## 2023-03-21 DIAGNOSIS — M75101 Unspecified rotator cuff tear or rupture of right shoulder, not specified as traumatic: Secondary | ICD-10-CM | POA: Diagnosis not present

## 2023-03-21 DIAGNOSIS — M25511 Pain in right shoulder: Secondary | ICD-10-CM | POA: Diagnosis not present

## 2023-03-21 DIAGNOSIS — M7551 Bursitis of right shoulder: Secondary | ICD-10-CM | POA: Diagnosis not present

## 2023-03-22 DIAGNOSIS — Z1231 Encounter for screening mammogram for malignant neoplasm of breast: Secondary | ICD-10-CM | POA: Diagnosis not present

## 2023-03-23 DIAGNOSIS — M25511 Pain in right shoulder: Secondary | ICD-10-CM | POA: Diagnosis not present

## 2023-03-23 DIAGNOSIS — M25611 Stiffness of right shoulder, not elsewhere classified: Secondary | ICD-10-CM | POA: Diagnosis not present

## 2023-03-30 DIAGNOSIS — M25611 Stiffness of right shoulder, not elsewhere classified: Secondary | ICD-10-CM | POA: Diagnosis not present

## 2023-03-30 DIAGNOSIS — M25511 Pain in right shoulder: Secondary | ICD-10-CM | POA: Diagnosis not present

## 2023-04-13 DIAGNOSIS — M25611 Stiffness of right shoulder, not elsewhere classified: Secondary | ICD-10-CM | POA: Diagnosis not present

## 2023-04-13 DIAGNOSIS — M25511 Pain in right shoulder: Secondary | ICD-10-CM | POA: Diagnosis not present

## 2023-04-20 DIAGNOSIS — M25611 Stiffness of right shoulder, not elsewhere classified: Secondary | ICD-10-CM | POA: Diagnosis not present

## 2023-04-20 DIAGNOSIS — M25511 Pain in right shoulder: Secondary | ICD-10-CM | POA: Diagnosis not present

## 2023-04-27 DIAGNOSIS — M25611 Stiffness of right shoulder, not elsewhere classified: Secondary | ICD-10-CM | POA: Diagnosis not present

## 2023-04-27 DIAGNOSIS — M25511 Pain in right shoulder: Secondary | ICD-10-CM | POA: Diagnosis not present

## 2023-05-04 DIAGNOSIS — M25511 Pain in right shoulder: Secondary | ICD-10-CM | POA: Diagnosis not present

## 2023-05-04 DIAGNOSIS — M25611 Stiffness of right shoulder, not elsewhere classified: Secondary | ICD-10-CM | POA: Diagnosis not present

## 2023-05-09 DIAGNOSIS — M75101 Unspecified rotator cuff tear or rupture of right shoulder, not specified as traumatic: Secondary | ICD-10-CM | POA: Diagnosis not present

## 2023-05-09 DIAGNOSIS — M25511 Pain in right shoulder: Secondary | ICD-10-CM | POA: Diagnosis not present

## 2023-05-09 DIAGNOSIS — M7551 Bursitis of right shoulder: Secondary | ICD-10-CM | POA: Diagnosis not present

## 2023-05-10 DIAGNOSIS — M25511 Pain in right shoulder: Secondary | ICD-10-CM | POA: Diagnosis not present

## 2023-05-10 DIAGNOSIS — M25611 Stiffness of right shoulder, not elsewhere classified: Secondary | ICD-10-CM | POA: Diagnosis not present

## 2023-07-21 DIAGNOSIS — Z8673 Personal history of transient ischemic attack (TIA), and cerebral infarction without residual deficits: Secondary | ICD-10-CM | POA: Diagnosis not present

## 2023-07-21 DIAGNOSIS — E042 Nontoxic multinodular goiter: Secondary | ICD-10-CM | POA: Diagnosis not present

## 2023-07-21 DIAGNOSIS — I10 Essential (primary) hypertension: Secondary | ICD-10-CM | POA: Diagnosis not present

## 2023-07-21 DIAGNOSIS — E785 Hyperlipidemia, unspecified: Secondary | ICD-10-CM | POA: Diagnosis not present

## 2023-08-08 DIAGNOSIS — M75101 Unspecified rotator cuff tear or rupture of right shoulder, not specified as traumatic: Secondary | ICD-10-CM | POA: Diagnosis not present

## 2023-08-08 DIAGNOSIS — M7551 Bursitis of right shoulder: Secondary | ICD-10-CM | POA: Diagnosis not present

## 2023-08-08 DIAGNOSIS — M25511 Pain in right shoulder: Secondary | ICD-10-CM | POA: Diagnosis not present

## 2024-01-28 ENCOUNTER — Ambulatory Visit (HOSPITAL_BASED_OUTPATIENT_CLINIC_OR_DEPARTMENT_OTHER)
Admission: EM | Admit: 2024-01-28 | Discharge: 2024-01-28 | Disposition: A | Discharge status: Home / Self Care | Payer: Self-pay

## 2024-01-28 ENCOUNTER — Encounter (HOSPITAL_BASED_OUTPATIENT_CLINIC_OR_DEPARTMENT_OTHER): Payer: Self-pay

## 2024-01-28 NOTE — ED Triage Notes (Addendum)
 Feeling urgency to void with little output x 1 week.  Abd pain severe this morning and nausea. Patient holding abdomen, shifting on chair, moaning. Patient has been unable to void since yesterday. States unable to provide a specimen. Patient stating she needs relief. Discussed with provider. Foley catheter and bladder scan not available at this location. Patient states she will go to local ER for further testing. Triage was able to be completed. Prior to patient leaving.
